# Patient Record
Sex: Male | Born: 1980 | Race: Asian | Hispanic: No | Marital: Married | Smoking: Never smoker
Health system: Southern US, Community
[De-identification: ages and names within clinical notes are randomized; demographics above are authoritative.]

---

## 2004-07-23 ENCOUNTER — Emergency Department (HOSPITAL_COMMUNITY): Admission: EM | Admit: 2004-07-23 | Discharge: 2004-07-24 | Payer: Self-pay | Admitting: Emergency Medicine

## 2008-10-21 ENCOUNTER — Emergency Department (HOSPITAL_COMMUNITY): Admission: EM | Admit: 2008-10-21 | Discharge: 2008-10-21 | Payer: Self-pay | Admitting: Emergency Medicine

## 2008-11-11 ENCOUNTER — Telehealth (INDEPENDENT_AMBULATORY_CARE_PROVIDER_SITE_OTHER): Payer: Self-pay | Admitting: *Deleted

## 2008-11-12 ENCOUNTER — Ambulatory Visit (HOSPITAL_COMMUNITY): Admission: RE | Admit: 2008-11-12 | Discharge: 2008-11-12 | Payer: Self-pay | Admitting: Gastroenterology

## 2008-11-12 ENCOUNTER — Ambulatory Visit: Payer: Self-pay | Admitting: Gastroenterology

## 2008-11-12 DIAGNOSIS — R1031 Right lower quadrant pain: Secondary | ICD-10-CM

## 2008-11-12 DIAGNOSIS — R1084 Generalized abdominal pain: Secondary | ICD-10-CM

## 2008-11-12 DIAGNOSIS — R110 Nausea: Secondary | ICD-10-CM

## 2008-11-12 DIAGNOSIS — R109 Unspecified abdominal pain: Secondary | ICD-10-CM | POA: Insufficient documentation

## 2008-11-15 ENCOUNTER — Telehealth: Payer: Self-pay | Admitting: Gastroenterology

## 2008-11-16 LAB — CONVERTED CEMR LAB
Albumin: 4.2 g/dL (ref 3.5–5.2)
Basophils Absolute: 0 10*3/uL (ref 0.0–0.1)
Basophils Relative: 0.5 % (ref 0.0–3.0)
CO2: 30 meq/L (ref 19–32)
Eosinophils Absolute: 0.2 10*3/uL (ref 0.0–0.7)
Eosinophils Relative: 1.8 % (ref 0.0–5.0)
GFR calc non Af Amer: 169.27 mL/min (ref >60)
Glucose, Bld: 98 mg/dL (ref 70–99)
H Pylori IgG: NEGATIVE
Lymphocytes Relative: 28.9 % (ref 12.0–46.0)
MCHC: 33.3 g/dL (ref 30.0–36.0)
Monocytes Absolute: 0.5 10*3/uL (ref 0.1–1.0)
Neutro Abs: 6.2 10*3/uL (ref 1.4–7.7)
Sodium: 136 meq/L (ref 135–145)
WBC: 9.7 10*3/uL (ref 4.5–10.5)

## 2008-11-22 ENCOUNTER — Ambulatory Visit: Payer: Self-pay | Admitting: Physician Assistant

## 2008-11-22 ENCOUNTER — Telehealth: Payer: Self-pay | Admitting: Physician Assistant

## 2008-11-22 LAB — CONVERTED CEMR LAB: Hep B S Ab: POSITIVE — AB

## 2008-11-24 ENCOUNTER — Telehealth: Payer: Self-pay | Admitting: Physician Assistant

## 2008-11-29 DIAGNOSIS — K7689 Other specified diseases of liver: Secondary | ICD-10-CM

## 2008-11-29 DIAGNOSIS — B1910 Unspecified viral hepatitis B without hepatic coma: Secondary | ICD-10-CM | POA: Insufficient documentation

## 2008-11-30 ENCOUNTER — Ambulatory Visit: Payer: Self-pay | Admitting: Internal Medicine

## 2008-11-30 DIAGNOSIS — R740 Nonspecific elevation of levels of transaminase and lactic acid dehydrogenase [LDH]: Secondary | ICD-10-CM

## 2008-12-14 ENCOUNTER — Telehealth: Payer: Self-pay | Admitting: Physician Assistant

## 2009-11-04 ENCOUNTER — Telehealth: Payer: Self-pay | Admitting: Physician Assistant

## 2010-02-17 ENCOUNTER — Emergency Department (HOSPITAL_COMMUNITY)
Admission: EM | Admit: 2010-02-17 | Discharge: 2010-02-17 | Payer: Self-pay | Source: Home / Self Care | Admitting: Emergency Medicine

## 2010-03-01 NOTE — Progress Notes (Signed)
Summary: Abd Pain  Phone Note Call from Patient Call back at Home Phone [phone]   Call For: Mike Gip, [place] Reason for Call: Talk to Nurse Summary of Call: Abd Pain Initial call taken by: Leanor Kail [facility],  November 04, 2009 11:16 AM  Follow-up for Phone Call        Patient c/o back pain, he thinks it is the same problem that Mike Gip PA saw him for last year.  He is having to lie down.  I have given him the number to [facility] that Mike Gip PA suggested last year.  He is also advised to try a Urgent care to help him with his back pain. Follow-up by: Darcey Nora RN, CGRN,  November 04, 2009 11:41 AM

## 2010-09-23 ENCOUNTER — Emergency Department (HOSPITAL_COMMUNITY): Payer: Self-pay

## 2010-09-23 ENCOUNTER — Emergency Department (HOSPITAL_COMMUNITY)
Admission: EM | Admit: 2010-09-23 | Discharge: 2010-09-23 | Disposition: A | Discharge status: Home / Self Care | Payer: Self-pay | Attending: Emergency Medicine | Admitting: Emergency Medicine

## 2010-09-23 DIAGNOSIS — R209 Unspecified disturbances of skin sensation: Secondary | ICD-10-CM | POA: Insufficient documentation

## 2010-09-23 DIAGNOSIS — H532 Diplopia: Secondary | ICD-10-CM | POA: Insufficient documentation

## 2010-09-23 LAB — POCT I-STAT, CHEM 8
Creatinine, Ser: 0.6 mg/dL (ref 0.50–1.35)
Hemoglobin: 16 g/dL (ref 13.0–17.0)
TCO2: 26 mmol/L (ref 0–100)

## 2012-09-01 ENCOUNTER — Ambulatory Visit (INDEPENDENT_AMBULATORY_CARE_PROVIDER_SITE_OTHER): Payer: BC Managed Care – PPO | Admitting: Emergency Medicine

## 2012-09-01 VITALS — BP 130/88 | HR 73 | Temp 98.0°F | Resp 16 | Ht 61.0 in | Wt 137.0 lb

## 2012-09-01 DIAGNOSIS — S139XXA Sprain of joints and ligaments of unspecified parts of neck, initial encounter: Secondary | ICD-10-CM

## 2012-09-01 DIAGNOSIS — S335XXA Sprain of ligaments of lumbar spine, initial encounter: Secondary | ICD-10-CM

## 2012-09-01 DIAGNOSIS — Z0000 Encounter for general adult medical examination without abnormal findings: Secondary | ICD-10-CM

## 2012-09-01 DIAGNOSIS — G5600 Carpal tunnel syndrome, unspecified upper limb: Secondary | ICD-10-CM

## 2012-09-01 LAB — POCT CBC
Granulocyte percent: 56 %G (ref 37–80)
Hemoglobin: 15.2 g/dL (ref 14.1–18.1)
Lymph, poc: 2.5 (ref 0.6–3.4)
MCHC: 32.8 g/dL (ref 31.8–35.4)
MCV: 87.9 fL (ref 80–97)
MPV: 9.8 fL (ref 0–99.8)
POC Granulocyte: 4 (ref 2–6.9)
POC LYMPH PERCENT: 35 %L (ref 10–50)
Platelet Count, POC: 302 10*3/uL (ref 142–424)
WBC: 7.2 10*3/uL (ref 4.6–10.2)

## 2012-09-01 MED ORDER — CYCLOBENZAPRINE HCL 10 MG PO TABS: 10.0000 mg | ORAL_TABLET | Freq: Three times a day (TID) | ORAL | Status: DC | PRN

## 2012-09-01 MED ORDER — NAPROXEN SODIUM 550 MG PO TABS: 550.0000 mg | ORAL_TABLET | Freq: Two times a day (BID) | ORAL | Status: DC

## 2012-09-01 NOTE — Patient Instructions (Addendum)
H?i Ch?ng ?ng C? Tay  (Carpal Tunnel Syndrome)  ?ng c? tay l m?t vng h?p n?m bn lng bn tay c?a c? tay. ?ng ???c hnh thnh b?i cc x??ng c? tay v dy ch?ng. Dy th?n kinh, m?ch mu v gn ?i qua ?ng c? tay. Chuy?n ??ng c? tay l?p ?i l?p l?i ho?c cc b?nh nh?t ??nh c th? gy s?ng trong ?ng ny. Ch? s?ng ny k?p dy th?n kinh chnh ? c? tay (dy th?n kinh trung v?) v gy tnh tr?ng ?au ? tay v cnh tay ???c g?i l h?i ch?ng ?ng c? tay. NGUYN NHN   Chuy?n ??ng c? tay l?p ?i l?p l?i.  Ch?n th??ng c? tay.  M?t s? b?nh c? th? nh? vim kh?p, b?nh ti?u ???ng, nghi?n r??u, c??ng gip v suy th?n.  Bo ph.  Mang thai. TRI?U CH?NG   C?m gic "r?n r?n nh? ki?n b" ? cc ngn tay ho?c bn tay.  Ng?a ran ho?c t ? ngn tay ho?c bn tay.  C?m gic ?au ? ton b? cnh tay.  ?au c? tay ?i ln cnh tay v vo vai.  ?au ?i xu?ng lng bn tay ho?c ngn tay.  C?m gic y?u trong bn tay. CH?N ?ON  Chuyn gia ch?m sc y t? s? xem b?nh s? c?a b?n v khm tr?c ti?p. C th? c?n ki?m tra ghi c? ?i?n. Ki?m tra ny ?o tn hi?u ?i?n ???c g?i ?i b?i cc c?. Cc tn hi?u ?i?n ny th??ng b? lm ch?m b?i h?i ch?ng ?ng c? tay. B?n c?ng c th? c?n ch?p X-quang.  ?I?U TR?  H?i ch?ng ?ng c? tay c th? t? kh?i. Chuyn gia ch?m sc y t? c?a b?n c th? ?? ngh? n?p c? tay ho?c s? d?ng thu?c, ch?ng h?n nh? thu?c khng vim khng c steroid. Tim hocmon ch?a vim v d? ?ng c th? gip ??. ?i khi, c th? c?n ph?u thu?t ?? gi?i phng dy th?n kinh b? k?p.  H??NG D?N CH?M SC T?I NH   S? d?ng t?t c? thu?c theo ch? d?n c?a chuyn gia ch?m sc y t?. Ch? s? d?ng thu?c mua tr?c ti?p t?i hi?u thu?c ho?c thu?c theo toa ?? gi?m ?au, gi?m s? kh ch?u ho?c h? s?t theo ch? d?n c?a chuyn gia ch?m sc y t? c?a b?n.  N?u b?n ???c n?p ?? gi? c? tay khng b? cong, hy s? d?ng n?p theo ch? d?n. B?n c?ng c?n mang n?p vo ban ?m. S? d?ng n?p cho ??n ch?ng no b?n cn b? ?au ho?c t trong bn tay, cnh tay ho?c c? tay. C th? m?t  1 ??n 2 thng.  Khng s? d?ng c? tay vo b?t k? ho?t ??ng no c th? gy ?au. N?u cc tri?u ch?ng c?a b?n c lin quan ??n cng vi?c, b?n c th? c?n ph?i ni chuy?n v?i s?p c?a mnh v? vi?c ??i sang m?t cng vi?c khng c?n s? d?ng c? tay.  Ch??m ? l?nh ln c? tay sau th?i gian di ho?t ??ng c? tay ko di.  Cho ? bo vo ti nh?a.  ??t kh?n t?m gi?a da v ti.  Ch??m ? l?nh trong 15 ??n 20 pht, 3 ??n 4 l?n m?i ngy.  Gi? m?i cu?c h?n khm l?i theo ch? d?n c?a chuyn gia ch?m sc y t?. Cc cu?c h?n ny bao g?m b?t k? cu?c h?n gi?i thi?u ch?nh hnh, v?t l tr? li?u v ph?c h?i ch?c n?ng no. B?t k? s? ch?m   tr? no trong vi?c nh?n ch?m sc c?n thi?t c th? d?n ??n s? ch?m tr? ho?c khng thnh cng trong vi?c ch?a lnh tnh tr?ng c?a b?n. HY NGAY L?P T?C THAM V?N V?I CHUYN GIA Y T? N?U:   B?n c nh?ng tri?u ch?ng m?i khng th? gi?i thch.  Cc tri?u ch?ng c?a b?n tr? nn t?i t? h?n v khng c?i thi?n ho?c ki?m sot ???c b?ng thu?c. ??M B?O B?N:   Hi?u cc h??ng d?n ny.  S? theo di tnh tr?ng c?a mnh.  S? yu c?u tr? gip ngay l?p t?c n?u b?n c?m th?y khng kh?e ho?c tnh tr?ng tr? nn t?i h?n. Document Released: 01/15/2005 Document Revised: 04/09/2011 [employer] Patient Information 2014 [employer].  

## 2012-09-01 NOTE — Progress Notes (Signed)
[facility] [address], [place] [address] [phone]  Date:  09/01/2012   Name:  Oscar [name]   DOB:  01/29/1981   MRN:  [id number]  PCP:  No PCP Per Patient    Chief Complaint: Headache, Neck Pain and Numbness   History of Present Illness:  Oscar [name] is a 32 y.o. very pleasant male patient who presents with the following:  Multiple complaints.  Has headaches and fatigue.  Has pain in back of neck and low back and radiating into leg on Left side.  Says he has numbness nad tingling in hands and a sensation of cold in his hands.  No history of injury or overuse.  Awakens at night with numbness and pain in median distribution.  No weakness.  No improvement in fatigue with tylenol or motrin.  Pain in low back worse with sitting or standing prolonged periods.  No improvement with over the counter medications or other home remedies. Denies other complaint or health concern today.   Patient Active Problem List   Diagnosis Date Noted  . ABNORMAL TRANSAMINASE, (LFT'S) 11/30/2008  . HEPATITIS B 11/29/2008  . FATTY LIVER DISEASE 11/29/2008  . NAUSEA ALONE 11/12/2008  . RLQ PAIN 11/12/2008  . ABDOMINAL PAIN, GENERALIZED 11/12/2008  . ABDOMINAL PAIN-MULTIPLE SITES 11/12/2008    History reviewed. No pertinent past medical history.  History reviewed. No pertinent past surgical history.  History  Substance Use Topics  . Smoking status: Never Smoker   . Smokeless tobacco: Not on file  . Alcohol Use: Yes    History reviewed. No pertinent family history.  No Known Allergies  Medication list has been reviewed and updated.  No current outpatient prescriptions on file prior to visit.   No current facility-administered medications on file prior to visit.    Review of Systems:  As per HPI, otherwise negative.    Physical Examination: Filed Vitals:   09/01/12 1452  BP: 130/88  Pulse: 73  Temp: 98 F (36.7 C)  Resp: 16   Filed Vitals:   09/01/12 1452   Height: 5\' 1"  (1.549 m)  Weight: 137 lb (62.143 kg)   Body mass index is 25.9 kg/(m^2). Ideal Body Weight: Weight in (lb) to have BMI = 25: 132  GEN: WDWN, NAD, Non-toxic, A & O x 3 HEENT: Atraumatic, Normocephalic. Neck supple. No masses, No LAD. Ears and Nose: No external deformity. CV: RRR, No M/G/R. No JVD. No thrill. No extra heart sounds. PULM: CTA B, no wheezes, crackles, rhonchi. No retractions. No resp. distress. No accessory muscle use. ABD: S, NT, ND, +BS. No rebound. No HSM. EXTR: No c/c/e  phalen and tinnel positive.   NEURO Normal gait.  PSYCH: Normally interactive. Conversant. Not depressed or anxious appearing.  Calm demeanor.    Assessment and Plan: Cervical and lumbar strain Carpal tunnel syndrome Never had labs done Labs   Signed,  Phillips Odor, MD

## 2012-09-02 LAB — COMPREHENSIVE METABOLIC PANEL
ALT: 29 U/L (ref 0–53)
Albumin: 4.8 g/dL (ref 3.5–5.2)
Calcium: 9.9 mg/dL (ref 8.4–10.5)
Creat: 0.85 mg/dL (ref 0.50–1.35)
Sodium: 138 mEq/L (ref 135–145)
Total Bilirubin: 0.6 mg/dL (ref 0.3–1.2)
Total Protein: 7.8 g/dL (ref 6.0–8.3)

## 2012-09-02 LAB — LIPID PANEL
Cholesterol: 201 mg/dL — ABNORMAL HIGH (ref 0–200)
Total CHOL/HDL Ratio: 4.2 Ratio

## 2012-09-02 LAB — VITAMIN D 25 HYDROXY (VIT D DEFICIENCY, FRACTURES): Vit D, 25-Hydroxy: 36 ng/mL (ref 30–89)

## 2012-09-02 LAB — VITAMIN B12: Vitamin B-12: 704 pg/mL (ref 211–911)

## 2012-09-02 LAB — TSH: TSH: 1.027 u[IU]/mL (ref 0.350–4.500)

## 2012-09-02 MED ORDER — ATORVASTATIN CALCIUM 20 MG PO TABS: 20.0000 mg | ORAL_TABLET | Freq: Every day | ORAL | Status: DC

## 2012-09-02 NOTE — Addendum Note (Signed)
Addended by: Carmelina Dane on: 09/02/2012 05:44 PM   Modules accepted: Orders

## 2012-09-15 ENCOUNTER — Ambulatory Visit (INDEPENDENT_AMBULATORY_CARE_PROVIDER_SITE_OTHER): Payer: BC Managed Care – PPO | Admitting: Neurology

## 2012-09-15 ENCOUNTER — Ambulatory Visit (INDEPENDENT_AMBULATORY_CARE_PROVIDER_SITE_OTHER): Payer: BC Managed Care – PPO

## 2012-09-15 DIAGNOSIS — G5600 Carpal tunnel syndrome, unspecified upper limb: Secondary | ICD-10-CM

## 2012-09-15 DIAGNOSIS — Z0289 Encounter for other administrative examinations: Secondary | ICD-10-CM

## 2012-09-15 DIAGNOSIS — R209 Unspecified disturbances of skin sensation: Secondary | ICD-10-CM

## 2012-09-15 DIAGNOSIS — G5602 Carpal tunnel syndrome, left upper limb: Secondary | ICD-10-CM

## 2012-09-15 NOTE — Procedures (Signed)
  HISTORY:  Oscar [name] is a 32 year old gentleman with a history of left hand numbness dating back several years. The patient also reports some discomfort in the left neck and shoulder area. The patient will occasionally drop things from the left hand. The patient is being evaluated for a possible neuropathy or a cervical radiculopathy.  NERVE CONDUCTION STUDIES:  Nerve conduction studies were performed on both upper extremities. The distal motor latencies for the median nerves were prolonged on the left, normal on the right, with normal motor amplitudes for these nerves bilaterally. The distal motor latencies and motor amplitudes for the ulnar nerves were normal bilaterally. The F wave latencies and nerve conduction velocities for the median and ulnar nerve were normal bilaterally. The sensory latencies for the median nerves were prolonged on the left, normal on the right, and normal for the ulnar nerves bilaterally.  EMG STUDIES:  EMG study was performed on the left upper extremity:  The first dorsal interosseous muscle reveals 2 to 4 K units with full recruitment. No fibrillations or positive waves were noted. The abductor pollicis brevis muscle reveals 2 to 4 K units with full recruitment. No fibrillations or positive waves were noted. The extensor indicis proprius muscle reveals 1 to 3 K units with full recruitment. No fibrillations or positive waves were noted. The pronator teres muscle reveals 2 to 3 K units with full recruitment. No fibrillations or positive waves were noted. The biceps muscle reveals 1 to 2 K units with full recruitment. No fibrillations or positive waves were noted. The triceps muscle reveals 2 to 4 K units with full recruitment. No fibrillations or positive waves were noted. The anterior deltoid muscle reveals 2 to 3 K units with full recruitment. No fibrillations or positive waves were noted. The cervical paraspinal muscles were tested at 2 levels. No abnormalities of  insertional activity were seen at either level tested. There was good relaxation.   IMPRESSION:  Nerve conduction studies done on both upper extremities reveals evidence of a mild left carpal tunnel syndrome. No other significant abnormalities were seen. EMG evaluation of the left upper extremity was normal. There is no evidence of a left cervical radiculopathy.  Marlan Palau MD 09/15/2012 4:32 PM  [facility] [address] [place], [place] [address]  Phone [phone] Fax [phone]

## 2012-09-19 ENCOUNTER — Telehealth: Payer: Self-pay

## 2012-09-19 NOTE — Telephone Encounter (Signed)
Patient is waiting on results from specialist doctor that our office referred him to.  [phone]

## 2012-09-22 ENCOUNTER — Telehealth: Payer: Self-pay | Admitting: Neurology

## 2012-09-22 NOTE — Telephone Encounter (Signed)
Okay. He is given the number to call them.

## 2012-09-22 NOTE — Telephone Encounter (Signed)
I called patient. The patient has left carpal tunnel syndrome. We have discussed this diagnosis before he left from the EMG evaluation. I went over with him once again. The patient is to go and contact his primary care physician to talk about treatment for this.

## 2012-09-24 ENCOUNTER — Ambulatory Visit (INDEPENDENT_AMBULATORY_CARE_PROVIDER_SITE_OTHER): Payer: BC Managed Care – PPO | Admitting: Emergency Medicine

## 2012-09-24 VITALS — BP 110/70 | HR 64 | Temp 98.3°F | Resp 18 | Ht 61.0 in | Wt 137.8 lb

## 2012-09-24 DIAGNOSIS — G5682 Other specified mononeuropathies of left upper limb: Secondary | ICD-10-CM

## 2012-09-24 DIAGNOSIS — G5600 Carpal tunnel syndrome, unspecified upper limb: Secondary | ICD-10-CM

## 2012-09-24 DIAGNOSIS — M5432 Sciatica, left side: Secondary | ICD-10-CM

## 2012-09-24 DIAGNOSIS — G5680 Other specified mononeuropathies of unspecified upper limb: Secondary | ICD-10-CM

## 2012-09-24 DIAGNOSIS — M5430 Sciatica, unspecified side: Secondary | ICD-10-CM

## 2012-09-24 NOTE — Patient Instructions (Addendum)
Oscar [name]  (Carpal Tunnel Syndrome)  [name] l m?t vng h?p n?m bn lng bn [name] c?a c? [name]. ?ng ???c hnh thnh b?i cc x?[name] v dy ch?ng. Dy th?n kinh, m?ch mu v gn ?i qua [name]. Chuy?n ?[name] l?p ?i l?p l?i ho?c cc b?nh nh?t ??nh c th? gy s?ng trong ?ng ny. Ch? s?ng ny k?p dy th?n kinh chnh ? c? [name] (dy th?n kinh trung v?) v gy tnh tr?ng ?au ? [name] v cnh [name] ???c g?i l Oscar [name]. NGUYN NHN   Chuy?n ?[name] l?p ?i l?p l?i.  Ch?n th?[name].  M?t s? b?nh c? th? nh? vim kh?p, b?nh ti?u ???ng, nghi?n r??u, c??ng gip v suy th?n.  Bo ph.  Mang thai. TRI?U CH?NG   C?m gic "r?n r?n nh? ki?n b" ? cc ngn [name] ho?c bn [name].  Ng?a ran ho?c t ? ngn [name] ho?c bn [name].  C?m gic ?au ? ton b? cnh [name].  ?au c? [name] ?i ln cnh [name] v vo vai.  ?au ?i xu?ng lng bn [name] ho?c ngn [name].  C?m gic y?u trong bn [name]. CH?N ?ON  Chuyn gia ch?m [place] y t? s? xem b?nh s? c?a b?n v khm tr?c ti?p. C th? c?n ki?m tra ghi c? ?i?n. Ki?m tra ny ?o tn hi?u ?i?n ???c g?i ?i b?i cc c?. Cc tn hi?u ?i?n ny th??ng b? lm ch?m b?i Oscar [name]. B?n c?ng c th? c?n ch?p X-quang.  ?I?U TR?  Oscar [name] c th? t? kh?i. Chuyn gia ch?m [place] y t? c?a b?n c th? ?? ngh? n?p c? [name] ho?c s? d?ng thu?c, ch?ng h?n nh? thu?c khng vim khng c steroid. Tim hocmon ch?a vim v d? ?ng c th? gip ??. ?i khi, c th? c?n ph?u thu?t ?? gi?i phng dy th?n kinh b? k?p.  H??NG D?N CH?M [place] T?I NH   S? d?ng t?t c? thu?c theo ch? d?n c?a chuyn gia ch?m [place] y t?. Ch? s? d?ng thu?c mua tr?c ti?p t?i hi?u thu?c ho?c thu?c theo toa ?? gi?m ?au, gi?m s? kh ch?u ho?c h? s?t theo ch? d?n c?a chuyn gia ch?m [place] y t? c?a b?n.  N?u b?n ???c n?p ?? gi? c? [name] khng b? cong, hy s? d?ng n?p theo ch? d?n. B?n c?ng c?n mang n?p vo ban ?m. S? d?ng n?p cho ??n ch?ng no b?n cn b? ?au ho?c t trong bn [name], cnh [name] ho?c c? [name]. C th? m?t  1 ??n 2 thng.  Khng s? d?ng c? [name] vo b?t k? ho?t ??ng no c th? gy ?au. N?u cc tri?u ch?ng c?a b?n c lin quan ??n cng vi?c, b?n c th? c?n ph?i ni chuy?n v?i s?p c?a mnh v? vi?c ??i sang m?t cng vi?c khng c?n s? d?ng c? [name].  Ch??m ? l?nh ln c? [name] sau th?i gian di ho?t ?[name] ko di.  Cho ? bo vo ti nh?a.  ??t kh?n t?m gi?a da v ti.  Ch??m ? l?nh trong 15 ??n 20 pht, 3 ??n 4 l?n m?i ngy.  Gi? m?i cu?c h?n khm l?i theo ch? d?n c?a chuyn gia ch?m [place] y t?. Cc cu?c h?n ny bao g?m b?t k? cu?c h?n gi?i thi?u ch?nh hnh, v?t l tr? li?u v ph?c Oscar ch?c n?ng no. B?t k? s? ch?m  tr? no trong vi?c nh?n ch?m [place] c?n thi?t c th? d?n ??n s? ch?m tr? ho?c khng thnh cng trong vi?c ch?a lnh tnh tr?ng c?a b?n. HY NGAY L?P T?C THAM V?N V?I CHUYN GIA Y T? N?U:   B?n c nh?ng tri?u ch?ng m?i khng th? gi?i thch.  Cc tri?u ch?ng c?a b?n tr? nn t?i t? h?n v khng c?i thi?n ho?c ki?m sot ???c b?ng thu?c. ??M B?O B?N:   Hi?u cc h??ng d?n ny.  S? theo di tnh tr?ng c?a mnh.  S? yu c?u tr? gip ngay l?p t?c n?u b?n c?m th?y khng kh?e ho?c tnh tr?ng tr? nn t?i h?n. Document Released: 01/15/2005 Document Revised: 04/09/2011 [facility] Patient Information 2014 [place], [place].

## 2012-09-24 NOTE — Progress Notes (Signed)
[facility] [address], [place] [address] [phone]  Date:  09/24/2012   Name:  Oscar [name]   DOB:  03/25/1980   MRN:  [id number]  PCP:  Carmelina Dane, MD    Chief Complaint: Follow-up   History of Present Illness:  Oscar [name] is a 32 y.o. 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  Is now complaining of pain into his shoulder from the hand.  Believes it associated with his hand.  Says that he has three bulging lumbar discs and improved with physical therapy in [place].  Worse now after 6 months of working.  Has pain in left thigh.    Patient Active Problem List   Diagnosis Date Noted  . ABNORMAL TRANSAMINASE, (LFT'S) 11/30/2008  . HEPATITIS B 11/29/2008  . FATTY LIVER DISEASE 11/29/2008  . NAUSEA ALONE 11/12/2008  . RLQ PAIN 11/12/2008  . ABDOMINAL PAIN, GENERALIZED 11/12/2008  . ABDOMINAL PAIN-MULTIPLE SITES 11/12/2008    History reviewed. No pertinent past medical history.  History reviewed. No pertinent past surgical history.  History  Substance Use Topics  . Smoking status: Never Smoker   . Smokeless tobacco: Not on file  . Alcohol Use: Yes    History reviewed. No pertinent family history.  No Known Allergies  Medication list has been reviewed and updated.  Current Outpatient Prescriptions on File Prior to Visit  Medication Sig Dispense Refill  . atorvastatin (LIPITOR) 20 MG tablet Take 1 tablet (20 mg total) by mouth daily.  90 tablet  3  . cyclobenzaprine (FLEXERIL) 10 MG tablet Take 1 tablet (10 mg total) by mouth 3 (three) times daily as needed for muscle spasms.  30 tablet  0  . naproxen sodium (ANAPROX DS) 550 MG tablet Take 1 tablet (550 mg total) by mouth 2 (two) times daily with a meal.  40 tablet  0   No current facility-administered medications on file prior to visit.    Review of Systems:  As per HPI, otherwise negative.    Physical Examination: Filed Vitals:   09/24/12 1543  BP: 110/70  Pulse: 64  Temp: 98.3 F (36.8 C)  Resp: 18   Filed Vitals:   09/24/12 1543  Height: 5\' 1"  (1.549 m)  Weight: 137 lb 12.8 oz (62.506 kg)   Body mass index is 26.05 kg/(m^2). Ideal Body Weight: Weight in (lb) to have BMI = 25: 132   GEN: WDWN, NAD, Non-toxic, Alert & Oriented x 3 HEENT: Atraumatic, Normocephalic.  Ears and Nose: No external deformity. EXTR: No clubbing/cyanosis/edema NEURO: Normal gait.  PSYCH: Normally interactive. Conversant. Not depressed or anxious appearing.  Calm demeanor.  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  Reproduces arm pain.  Neuro lower extremities is normal.  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   Signed,  Phillips Odor, MD

## 2012-12-04 ENCOUNTER — Other Ambulatory Visit: Payer: Self-pay

## 2013-08-02 ENCOUNTER — Emergency Department (HOSPITAL_COMMUNITY)
Admission: EM | Admit: 2013-08-02 | Discharge: 2013-08-02 | Disposition: A | Discharge status: Home / Self Care | Payer: BC Managed Care – PPO | Attending: Emergency Medicine | Admitting: Emergency Medicine

## 2013-08-02 ENCOUNTER — Emergency Department (HOSPITAL_COMMUNITY): Payer: BC Managed Care – PPO

## 2013-08-02 ENCOUNTER — Encounter (HOSPITAL_COMMUNITY): Payer: Self-pay | Admitting: Emergency Medicine

## 2013-08-02 DIAGNOSIS — Z791 Long term (current) use of non-steroidal anti-inflammatories (NSAID): Secondary | ICD-10-CM | POA: Insufficient documentation

## 2013-08-02 DIAGNOSIS — K529 Noninfective gastroenteritis and colitis, unspecified: Secondary | ICD-10-CM

## 2013-08-02 DIAGNOSIS — Z79899 Other long term (current) drug therapy: Secondary | ICD-10-CM | POA: Insufficient documentation

## 2013-08-02 DIAGNOSIS — K5289 Other specified noninfective gastroenteritis and colitis: Secondary | ICD-10-CM | POA: Insufficient documentation

## 2013-08-02 LAB — COMPREHENSIVE METABOLIC PANEL
ALBUMIN: 3.7 g/dL (ref 3.5–5.2)
ALT: 18 U/L (ref 0–53)
AST: 19 U/L (ref 0–37)
Alkaline Phosphatase: 56 U/L (ref 39–117)
Anion gap: 15 (ref 5–15)
BUN: 8 mg/dL (ref 6–23)
CALCIUM: 9.2 mg/dL (ref 8.4–10.5)
CHLORIDE: 97 meq/L (ref 96–112)
CO2: 24 meq/L (ref 19–32)
CREATININE: 0.62 mg/dL (ref 0.50–1.35)
GLUCOSE: 145 mg/dL — AB (ref 70–99)
Potassium: 3.1 mEq/L — ABNORMAL LOW (ref 3.7–5.3)
Sodium: 136 mEq/L — ABNORMAL LOW (ref 137–147)
TOTAL PROTEIN: 8 g/dL (ref 6.0–8.3)
Total Bilirubin: 0.6 mg/dL (ref 0.3–1.2)

## 2013-08-02 LAB — URINALYSIS, ROUTINE W REFLEX MICROSCOPIC
BILIRUBIN URINE: NEGATIVE
Glucose, UA: NEGATIVE mg/dL
HGB URINE DIPSTICK: NEGATIVE
Ketones, ur: NEGATIVE mg/dL
Leukocytes, UA: NEGATIVE
Nitrite: NEGATIVE
PH: 6.5 (ref 5.0–8.0)
PROTEIN: NEGATIVE mg/dL
SPECIFIC GRAVITY, URINE: 1.016 (ref 1.005–1.030)
Urobilinogen, UA: 0.2 mg/dL (ref 0.0–1.0)

## 2013-08-02 LAB — CBC WITH DIFFERENTIAL/PLATELET
BASOS ABS: 0 10*3/uL (ref 0.0–0.1)
BASOS PCT: 0 % (ref 0–1)
Eosinophils Absolute: 0 10*3/uL (ref 0.0–0.7)
Eosinophils Relative: 0 % (ref 0–5)
HEMATOCRIT: 42.2 % (ref 39.0–52.0)
HEMOGLOBIN: 14.6 g/dL (ref 13.0–17.0)
LYMPHS PCT: 14 % (ref 12–46)
Lymphs Abs: 1.8 10*3/uL (ref 0.7–4.0)
MCH: 29 pg (ref 26.0–34.0)
MCHC: 34.6 g/dL (ref 30.0–36.0)
MCV: 83.7 fL (ref 78.0–100.0)
Monocytes Absolute: 0.9 10*3/uL (ref 0.1–1.0)
Monocytes Relative: 7 % (ref 3–12)
NEUTROS ABS: 10.6 10*3/uL — AB (ref 1.7–7.7)
Neutrophils Relative %: 79 % — ABNORMAL HIGH (ref 43–77)
Platelets: 249 10*3/uL (ref 150–400)
RBC: 5.04 MIL/uL (ref 4.22–5.81)
RDW: 13.2 % (ref 11.5–15.5)
WBC: 13.3 10*3/uL — ABNORMAL HIGH (ref 4.0–10.5)

## 2013-08-02 LAB — LIPASE, BLOOD: Lipase: 20 U/L (ref 11–59)

## 2013-08-02 MED ORDER — DICYCLOMINE HCL 20 MG PO TABS: ORAL_TABLET | ORAL | Status: DC

## 2013-08-02 MED ORDER — PROMETHAZINE HCL 25 MG PO TABS: 25.0000 mg | ORAL_TABLET | Freq: Four times a day (QID) | ORAL | Status: DC | PRN

## 2013-08-02 MED ORDER — KETOROLAC TROMETHAMINE 30 MG/ML IJ SOLN
30.0000 mg | Freq: Once | INTRAMUSCULAR | Status: AC
  Administered 2013-08-02: 30 mg via INTRAVENOUS
  Filled 2013-08-02: qty 1

## 2013-08-02 MED ORDER — SODIUM CHLORIDE 0.9 % IV BOLUS (SEPSIS)
1000.0000 mL | Freq: Once | INTRAVENOUS | Status: AC
  Administered 2013-08-02: 1000 mL via INTRAVENOUS

## 2013-08-02 NOTE — ED Notes (Signed)
Pt is in a lot of pain in stomach. He states he can feel it to his back and it feels cold.

## 2013-08-02 NOTE — ED Notes (Signed)
Patient transported to X-ray 

## 2013-08-02 NOTE — ED Notes (Signed)
The pt is c/o  A headache and and flank pain with nv and diarrhea since Thursday.  His bones feel cold and he is hurting all over his body

## 2013-08-02 NOTE — Discharge Instructions (Signed)
Follow up with your md in 1-2 days for recheck.  Drink plenty of fluids

## 2013-08-02 NOTE — ED Notes (Signed)
Patient declines wheelchair at discharge.  Patient escorted to lobby by RN.  

## 2013-08-02 NOTE — ED Provider Notes (Signed)
CSN: [id number]     Arrival date & time 08/02/13  1604 History   First MD Initiated Contact with Patient 08/02/13 1751     Chief Complaint  Patient presents with  . multiple complaints      (Consider location/radiation/quality/duration/timing/severity/associated sxs/prior Treatment) Patient is a 33 y.o. male presenting with diarrhea. The history is provided by the patient (the pt comlains of abd cramps and diarhea since yesterday).  Diarrhea Quality:  Malodorous Severity:  Moderate Onset quality:  Sudden Timing:  Constant Progression:  Unchanged Relieved by:  Nothing Associated symptoms: no abdominal pain and no headaches     History reviewed. No pertinent past medical history. History reviewed. No pertinent past surgical history. No family history on file. History  Substance Use Topics  . Smoking status: Never Smoker   . Smokeless tobacco: Not on file  . Alcohol Use: Yes    Review of Systems  Constitutional: Negative for appetite change and fatigue.  HENT: Negative for congestion, ear discharge and sinus pressure.   Eyes: Negative for discharge.  Respiratory: Negative for cough.   Cardiovascular: Negative for chest pain.  Gastrointestinal: Positive for diarrhea. Negative for abdominal pain.  Genitourinary: Negative for frequency and hematuria.  Musculoskeletal: Negative for back pain.  Skin: Negative for rash.  Neurological: Negative for seizures and headaches.  Psychiatric/Behavioral: Negative for hallucinations.      Allergies  Review of patient's allergies indicates no known allergies.  Home Medications   Prior to Admission medications   Medication Sig Start Date End Date Taking? Authorizing Provider  atorvastatin (LIPITOR) 20 MG tablet Take 1 tablet (20 mg total) by mouth daily. 09/02/12   Phillips OdorJeffery Anderson, MD  cyclobenzaprine (FLEXERIL) 10 MG tablet Take 1 tablet (10 mg total) by mouth 3 (three) times daily as needed for muscle spasms. 09/01/12   Phillips OdorJeffery  Anderson, MD  dicyclomine (BENTYL) 20 MG tablet Take one every 6 hours for abdominal cramps 08/02/13   Benny LennertJoseph L Taevin Mcferran, MD  naproxen sodium (ANAPROX DS) 550 MG tablet Take 1 tablet (550 mg total) by mouth 2 (two) times daily with a meal. 09/01/12 09/01/13  Phillips OdorJeffery Anderson, MD  promethazine (PHENERGAN) 25 MG tablet Take 1 tablet (25 mg total) by mouth every 6 (six) hours as needed for nausea or vomiting. 08/02/13   Benny LennertJoseph L Yadriel Kerrigan, MD   BP 107/79  Pulse 84  Temp(Src) 99.3 F (37.4 C) (Oral)  Resp 20  SpO2 98% Physical Exam  Constitutional: He is oriented to person, place, and time. He appears well-developed.  HENT:  Head: Normocephalic.  Eyes: Conjunctivae and EOM are normal. No scleral icterus.  Neck: Neck supple. No thyromegaly present.  Cardiovascular: Normal rate and regular rhythm.  Exam reveals no gallop and no friction rub.   No murmur heard. Pulmonary/Chest: No stridor. He has no wheezes. He has no rales. He exhibits no tenderness.  Abdominal: He exhibits no distension. There is tenderness. There is no rebound.  Mild tenderness throughout abd  Musculoskeletal: Normal range of motion. He exhibits no edema.  Lymphadenopathy:    He has no cervical adenopathy.  Neurological: He is oriented to person, place, and time. He exhibits normal muscle tone. Coordination normal.  Skin: No rash noted. No erythema.  Psychiatric: He has a normal mood and affect. His behavior is normal.    ED Course  Procedures (including critical care time) Labs Review Labs Reviewed  CBC WITH DIFFERENTIAL - Abnormal; Notable for the following:    WBC 13.3 (*)  Neutrophils Relative % 79 (*)    Neutro Abs 10.6 (*)    All other components within normal limits  COMPREHENSIVE METABOLIC PANEL - Abnormal; Notable for the following:    Sodium 136 (*)    Potassium 3.1 (*)    Glucose, Bld 145 (*)    All other components within normal limits  URINALYSIS, ROUTINE W REFLEX MICROSCOPIC - Abnormal; Notable for the  following:    APPearance CLOUDY (*)    All other components within normal limits  LIPASE, BLOOD    Imaging Review Dg Abd Acute W/chest  08/02/2013   CLINICAL DATA:  Diffuse abdominal pain, vomiting, nausea.  EXAM: ACUTE ABDOMEN SERIES (ABDOMEN 2 VIEW & CHEST 1 VIEW)  COMPARISON:  02/17/2010  FINDINGS: There is no evidence of dilated bowel loops or free intraperitoneal air. No radiopaque calculi or other significant radiographic abnormality is seen. Heart size and mediastinal contours are within normal limits. Both lungs are clear.  IMPRESSION: Negative abdominal radiographs.  No acute cardiopulmonary disease.   Electronically Signed   By: Charlett NoseKevin  Dover M.D.   On: 08/02/2013 18:51     EKG Interpretation None      MDM   Final diagnoses:  Gastroenteritis    Gastroenteritis.  tx with bentyl and phenergan and close follow up with pcp    Benny LennertJoseph L Harvey Lingo, MD 08/02/13 [phone]

## 2013-08-04 ENCOUNTER — Ambulatory Visit (INDEPENDENT_AMBULATORY_CARE_PROVIDER_SITE_OTHER): Payer: BC Managed Care – PPO | Admitting: Family Medicine

## 2013-08-04 VITALS — BP 104/70 | HR 61 | Temp 97.7°F | Resp 16 | Ht 61.0 in | Wt 139.4 lb

## 2013-08-04 DIAGNOSIS — R1084 Generalized abdominal pain: Secondary | ICD-10-CM

## 2013-08-04 DIAGNOSIS — R51 Headache: Secondary | ICD-10-CM

## 2013-08-04 DIAGNOSIS — R197 Diarrhea, unspecified: Secondary | ICD-10-CM

## 2013-08-04 DIAGNOSIS — R110 Nausea: Secondary | ICD-10-CM

## 2013-08-04 LAB — POCT URINALYSIS DIPSTICK
Bilirubin, UA: NEGATIVE
Blood, UA: NEGATIVE
Glucose, UA: NEGATIVE
Ketones, UA: NEGATIVE
Leukocytes, UA: NEGATIVE
NITRITE UA: NEGATIVE
PH UA: 6
PROTEIN UA: NEGATIVE
Urobilinogen, UA: 0.2

## 2013-08-04 LAB — POCT UA - MICROSCOPIC ONLY
BACTERIA, U MICROSCOPIC: NEGATIVE
CRYSTALS, UR, HPF, POC: NEGATIVE
Casts, Ur, LPF, POC: NEGATIVE
EPITHELIAL CELLS, URINE PER MICROSCOPY: NEGATIVE
MUCUS UA: NEGATIVE
YEAST UA: NEGATIVE

## 2013-08-04 LAB — CBC
HEMATOCRIT: 40.4 % (ref 39.0–52.0)
HEMOGLOBIN: 14.2 g/dL (ref 13.0–17.0)
MCH: 28.6 pg (ref 26.0–34.0)
MCHC: 35.1 g/dL (ref 30.0–36.0)
MCV: 81.3 fL (ref 78.0–100.0)
Platelets: 302 10*3/uL (ref 150–400)
RBC: 4.97 MIL/uL (ref 4.22–5.81)
RDW: 13.3 % (ref 11.5–15.5)
WBC: 5.9 10*3/uL (ref 4.0–10.5)

## 2013-08-04 MED ORDER — TRAMADOL HCL 50 MG PO TABS: ORAL_TABLET | ORAL | Status: DC

## 2013-08-04 MED ORDER — DIPHENOXYLATE-ATROPINE 2.5-0.025 MG PO TABS: 1.0000 | ORAL_TABLET | Freq: Four times a day (QID) | ORAL | Status: DC | PRN

## 2013-08-04 NOTE — Progress Notes (Signed)
Subjective: 33 year old patient who was in the emergency room a couple days ago. He had diarrhea onset late night Friday. He has had cramping and diarrhea ever since then. He had some fever low grade on Sunday. He to the emergency room and workup suggested gastroenteritis. His white count is 13,000 the time. He has continued to feel bad. The diarrhea persists. He does have been able to go to work. Is not actually having diarrhea for 5 times a day. He has a lot of abdominal cramping. He hurts in his epigastrium and across lower abdomen. He has not been vomiting though he has been nauseous. He's been taking Phenergan and Bentyl without relief. He has bad headache.  Objective: His TMs are normal. Eyes PERRLA. Throat clear. Neck supple without nodes or thyromegaly. Chest clear. Heart regular without murmurs. Abdomen has occasional bowel sounds. Soft but tender in the epigastrium, across the upper abdomen, across lower abdomen in both lower quadrants. Skin unremarkable.   Results for orders placed in visit on 08/04/13  POCT URINALYSIS DIPSTICK      Result Value Ref Range   Color, UA yellow     Clarity, UA clear     Glucose, UA neg     Bilirubin, UA neg     Ketones, UA neg     Spec Grav, UA <=1.005     Blood, UA neg     pH, UA 6.0     Protein, UA neg     Urobilinogen, UA 0.2     Nitrite, UA neg     Leukocytes, UA Negative    POCT UA - MICROSCOPIC ONLY      Result Value Ref Range   WBC, Ur, HPF, POC 0-1     RBC, urine, microscopic 0-1     Bacteria, U Microscopic neg     Mucus, UA neg     Epithelial cells, urine per micros neg     Crystals, Ur, HPF, POC neg     Casts, Ur, LPF, POC neg     Yeast, UA neg     Assessment: Abdominal cramping pain and diarrhea and nausea Headaches  Plan: Try Lomotil. Gave him tramadol for the headache. Check labs. Unfortunately our CBC machine is down and won't have that count back up the leg. The urinalysis is normal. See instructions

## 2013-08-04 NOTE — Patient Instructions (Addendum)
Stay out of work through tomorrow.  Take lomotil (diphenoxylate) one tab 4 times daily for diarrhea.  Discontinue the diclyclomine (bentyl)  Tramadol one every 6 hours if needed for headache.  Return if not improving.  If severe abdominal pain at any time return or go to ER.

## 2013-08-05 ENCOUNTER — Telehealth: Payer: Self-pay | Admitting: Radiology

## 2013-08-05 DIAGNOSIS — R1084 Generalized abdominal pain: Secondary | ICD-10-CM

## 2013-08-05 NOTE — Telephone Encounter (Signed)
Per [facility], pt's BMP couldn't be ran due to there being no label on the SST tube. I talked to Dr Alwyn RenHopper, and he said to call the patient and see how he is feeling. If he is feeling better, he doesn't need to come back in for another blood draw. If, however, he is not feeling any better, he will need to rtc for another BMP.   I called pt and lmom to cb.

## 2013-08-06 NOTE — Telephone Encounter (Signed)
Spoke to pt, he is still experiencing abdominal pain, it has improved slightly only. He will RTC to have his BMP drawn today. Future order placed for BMP.

## 2013-08-19 NOTE — Telephone Encounter (Signed)
Is not taking the medication, for diarrhea.     Has pain in his stomach to his back.    Needs medication for pain.    [phone]

## 2013-09-29 ENCOUNTER — Ambulatory Visit (INDEPENDENT_AMBULATORY_CARE_PROVIDER_SITE_OTHER): Payer: BC Managed Care – PPO | Admitting: Family Medicine

## 2013-09-29 VITALS — BP 110/84 | HR 64 | Temp 98.1°F | Resp 18 | Ht 61.0 in | Wt 139.0 lb

## 2013-09-29 DIAGNOSIS — E785 Hyperlipidemia, unspecified: Secondary | ICD-10-CM

## 2013-09-29 DIAGNOSIS — M545 Low back pain, unspecified: Secondary | ICD-10-CM

## 2013-09-29 DIAGNOSIS — M542 Cervicalgia: Secondary | ICD-10-CM

## 2013-09-29 DIAGNOSIS — G5603 Carpal tunnel syndrome, bilateral upper limbs: Secondary | ICD-10-CM

## 2013-09-29 DIAGNOSIS — G5600 Carpal tunnel syndrome, unspecified upper limb: Secondary | ICD-10-CM

## 2013-09-29 LAB — COMPLETE METABOLIC PANEL WITH GFR
ALT: 31 U/L (ref 0–53)
AST: 29 U/L (ref 0–37)
Albumin: 4.8 g/dL (ref 3.5–5.2)
Alkaline Phosphatase: 48 U/L (ref 39–117)
BUN: 12 mg/dL (ref 6–23)
CO2: 27 mEq/L (ref 19–32)
Calcium: 9.8 mg/dL (ref 8.4–10.5)
Chloride: 100 mEq/L (ref 96–112)
Creat: 0.58 mg/dL (ref 0.50–1.35)
GFR, Est African American: >89 mL/min
GFR, Est Non African American: >89 mL/min
Glucose, Bld: 85 mg/dL (ref 70–99)
Potassium: 4 mEq/L (ref 3.5–5.3)
Sodium: 138 mEq/L (ref 135–145)
Total Bilirubin: 0.6 mg/dL (ref 0.2–1.2)
Total Protein: 8.1 g/dL (ref 6.0–8.3)

## 2013-09-29 LAB — LIPID PANEL
Cholesterol: 215 mg/dL — ABNORMAL HIGH (ref 0–200)
HDL: 52 mg/dL (ref >39)
LDL Cholesterol: 114 mg/dL — ABNORMAL HIGH (ref 0–99)
Total CHOL/HDL Ratio: 4.1 Ratio
Triglycerides: 246 mg/dL — ABNORMAL HIGH (ref <150)
VLDL: 49 mg/dL — ABNORMAL HIGH (ref 0–40)

## 2013-09-29 MED ORDER — PREDNISONE 20 MG PO TABS: 40.0000 mg | ORAL_TABLET | Freq: Every day | ORAL | Status: DC

## 2013-09-29 NOTE — Progress Notes (Signed)
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 He comes in with several years of low back pain, 1 year of bilateral carpal tunnel, worse on the left, left upper neck pain, left knee pain, and need for reevaluation of cholesterol levels.  Patient's been on Lipitor for quite some time. He denies jaundice or right upper quadrant pain.  Patient notes bilateral volar wrist pain, worse on the left where it radiates into the thenar area. He has full range of motion of both wrists, in all digits. Patient is tender with light palpation over the volar wrist.  Patient has no pain when he bends over has no weakness in either leg clear denies numbness in his legs either.  Objective: Patient is alert and cooperative and in no acute distress Neck range of motion is normal. He is mildly tender in the left upper posterior cervical spine. Is no overlying rash. Patient is normal straight leg raising. His reflexes in his elbows and knees and ankles are all symmetric. There is no muscle wasting in any of his extremities.  Does have a positive Tinel's of the left wrist. There is no swelling or bony abnormality is seen.  I reviewed his medical record and he's been to the emergency room several times, he is seen a neurologist who diagnosed him with carpal tunnel syndrome, and referred back to [place].  Assessment: Multiple problems referable to join and muscle. He also has hyperlipidemia history.  It's possible that some of these muscle tissues may be related to the Lipitor.  Plan:Bilateral carpal tunnel syndrome - Plan: predniSONE (DELTASONE) 20 MG tablet  Other and unspecified hyperlipidemia - Plan: Lipid panel, COMPLETE METABOLIC PANEL WITH GFR  Neck pain - Plan: predniSONE (DELTASONE) 20 MG tablet  Bilateral low back pain without sciatica - Plan: predniSONE (DELTASONE) 20 MG tablet  Signed, Elvina Sidle, MD

## 2013-09-29 NOTE — Patient Instructions (Signed)
High Cholesterol High cholesterol refers to having a high level of cholesterol in your blood. Cholesterol is a white, waxy, fat-like protein that your body needs in small amounts. Your liver makes all the cholesterol you need. Excess cholesterol comes from the food you eat. Cholesterol travels in your bloodstream through your blood vessels. If you have high cholesterol, deposits (plaque) may build up on the walls of your blood vessels. This makes the arteries narrower and stiffer. Plaque increases your risk of heart attack and stroke. Work with your health care provider to keep your cholesterol levels in a healthy range. RISK FACTORS Several things can make you more likely to have high cholesterol. These include:   Eating foods high in animal fat (saturated fat) or cholesterol.  Being overweight.  Not getting enough exercise.  Having a family history of high cholesterol. SIGNS AND SYMPTOMS High cholesterol does not cause symptoms. DIAGNOSIS  Your health care provider can do a blood test to check whether you have high cholesterol. If you are older than 20, your health care provider may check your cholesterol every 4-6 years. You may be checked more often if you already have high cholesterol or other risk factors for heart disease. The blood test for cholesterol measures the following:  Bad cholesterol (LDL cholesterol). This is the type of cholesterol that causes heart disease. This number should be less than 100.  Good cholesterol (HDL cholesterol). This type helps protect against heart disease. A healthy level of HDL cholesterol is 60 or higher.  Total cholesterol. This is the combined number of LDL cholesterol and HDL cholesterol. A healthy number is less than 200. TREATMENT  High cholesterol can be treated with diet changes, lifestyle changes, and medicine.   Diet changes may include eating more whole grains, fruits, vegetables, nuts, and fish. You may also have to cut back on red meat  and foods with a lot of added sugar.  Lifestyle changes may include getting at least 40 minutes of aerobic exercise three times a week. Aerobic exercises include walking, biking, and swimming. Aerobic exercise along with a healthy diet can help you maintain a healthy weight. Lifestyle changes may also include quitting smoking.  If diet and lifestyle changes are not enough to lower your cholesterol, your health care provider may prescribe a statin medicine. This medicine has been shown to lower cholesterol and also lower the risk of heart disease. HOME CARE INSTRUCTIONS  Only take over-the-counter or prescription medicines as directed by your health care provider.   Follow a healthy diet as directed by your health care provider. For instance:   Eat chicken (without skin), fish, veal, shellfish, ground turkey breast, and round or loin cuts of red meat.  Do not eat fried foods and fatty meats, such as hot dogs and salami.   Eat plenty of fruits, such as apples.   Eat plenty of vegetables, such as broccoli, potatoes, and carrots.   Eat beans, peas, and lentils.   Eat grains, such as barley, rice, couscous, and bulgur wheat.   Eat pasta without cream sauces.   Use skim or nonfat milk and low-fat or nonfat yogurt and cheeses. Do not eat or drink whole milk, cream, ice cream, egg yolks, and hard cheeses.   Do not eat stick margarine or tub margarines that contain trans fats (also called partially hydrogenated oils).   Do not eat cakes, cookies, crackers, or other baked goods that contain trans fats.   Do not eat saturated tropical oils, such as   coconut and palm oil.   Exercise as directed by your health care provider. Increase your activity level with activities such as gardening or walking.   Keep all follow-up appointments.  SEEK MEDICAL CARE IF:  You are struggling to maintain a healthy diet or weight.  You need help starting an exercise program.  You need help to  stop smoking. SEEK IMMEDIATE MEDICAL CARE IF:  You have chest pain.  You have trouble breathing. Document Released: 01/15/2005 Document Revised: 06/01/2013 Document Reviewed: 11/07/2012 [facility] Patient Information 2015 [place], [place]. This information is not intended to replace advice given to you by your health care provider. Make sure you discuss any questions you have with your health care provider. Carpal Tunnel Release Carpal tunnel release is done to relieve the pressure on the nerves and tendons on the bottom side of your wrist.  LET YOUR CAREGIVER KNOW ABOUT:   Allergies to food or medicine.  Medicines taken, including vitamins, herbs, eyedrops, over-the-counter medicines, and creams.  Use of steroids (by mouth or creams).  Previous problems with anesthetics or numbing medicines.  History of bleeding problems or blood clots.  Previous surgery.  Other health problems, including diabetes and kidney problems.  Possibility of pregnancy, if this applies. RISKS AND COMPLICATIONS  Some problems that may happen after this procedure include:  Infection.  Damage to the nerves, arteries or tendons could occur. This would be very uncommon.  Bleeding. BEFORE THE PROCEDURE   This surgery may be done while you are asleep (general anesthetic) or may be done under a block where only your forearm and the surgical area is numb.  If the surgery is done under a block, the numbness will gradually wear off within several hours after surgery. HOME CARE INSTRUCTIONS   Have a responsible person with you for 24 hours.  Do not drive a car or use public transportation for 24 hours.  Only take over-the-counter or prescription medicines for pain, discomfort, or fever as directed by your caregiver. Take them as directed.  You may put ice on the palm side of the affected wrist.  Put ice in a plastic bag.  Place a towel between your skin and the bag.  Leave the ice on for 20 to 30  minutes, 4 times per day.  If you were given a splint to keep your wrist from bending, use it as directed. It is important to wear the splint at night or as directed. Use the splint for as long as you have pain or numbness in your hand, arm, or wrist. This may take 1 to 2 months.  Keep your hand raised (elevated) above the level of your heart as much as possible. This keeps swelling down and helps with discomfort.  Change bandages (dressings) as directed.  Keep the wound clean and dry. SEEK MEDICAL CARE IF:   You develop pain not relieved with medications.  You develop numbness of your hand.  You develop bleeding from your surgical site.  You have an oral temperature above 102 F (38.9 C).  You develop redness or swelling of the surgical site.  You develop new, unexplained problems. SEEK IMMEDIATE MEDICAL CARE IF:   You develop a rash.  You have difficulty breathing.  You develop any reaction or side effects to medications given. Document Released: 04/07/2003 Document Revised: 04/09/2011 Document Reviewed: 11/21/2006 [facility] Patient Information 2015 [place], [place]. This information is not intended to replace advice given to you by your health care provider. Make sure you discuss any  questions you have with your health care provider.  

## 2013-10-05 ENCOUNTER — Ambulatory Visit (INDEPENDENT_AMBULATORY_CARE_PROVIDER_SITE_OTHER): Payer: BC Managed Care – PPO | Admitting: Family Medicine

## 2013-10-05 VITALS — BP 120/76 | HR 62 | Temp 98.0°F | Resp 16 | Ht 61.0 in | Wt 140.4 lb

## 2013-10-05 DIAGNOSIS — M791 Myalgia, unspecified site: Secondary | ICD-10-CM

## 2013-10-05 DIAGNOSIS — G5600 Carpal tunnel syndrome, unspecified upper limb: Secondary | ICD-10-CM

## 2013-10-05 DIAGNOSIS — M5432 Sciatica, left side: Secondary | ICD-10-CM

## 2013-10-05 DIAGNOSIS — G5602 Carpal tunnel syndrome, left upper limb: Secondary | ICD-10-CM

## 2013-10-05 DIAGNOSIS — M5430 Sciatica, unspecified side: Secondary | ICD-10-CM

## 2013-10-05 DIAGNOSIS — E876 Hypokalemia: Secondary | ICD-10-CM

## 2013-10-05 DIAGNOSIS — E785 Hyperlipidemia, unspecified: Secondary | ICD-10-CM

## 2013-10-05 DIAGNOSIS — L309 Dermatitis, unspecified: Secondary | ICD-10-CM

## 2013-10-05 DIAGNOSIS — IMO0001 Reserved for inherently not codable concepts without codable children: Secondary | ICD-10-CM

## 2013-10-05 DIAGNOSIS — L259 Unspecified contact dermatitis, unspecified cause: Secondary | ICD-10-CM

## 2013-10-05 MED ORDER — SIMVASTATIN 20 MG PO TABS: 20.0000 mg | ORAL_TABLET | Freq: Every day | ORAL | Status: AC

## 2013-10-05 MED ORDER — METHYLPREDNISOLONE (PAK) 4 MG PO TABS: ORAL_TABLET | ORAL | Status: DC

## 2013-10-05 MED ORDER — POTASSIUM CHLORIDE CRYS ER 20 MEQ PO TBCR: 20.0000 meq | EXTENDED_RELEASE_TABLET | Freq: Two times a day (BID) | ORAL | Status: DC

## 2013-10-05 MED ORDER — FLUOCINONIDE-E 0.05 % EX CREA: 1.0000 "application " | TOPICAL_CREAM | Freq: Two times a day (BID) | CUTANEOUS | Status: AC

## 2013-10-05 NOTE — Patient Instructions (Signed)
Cholesterol Cholesterol is a white, waxy, fat-like substance needed by your body in small amounts. The liver makes all the cholesterol you need. Cholesterol is carried from the liver by the blood through the blood vessels. Deposits of cholesterol (plaque) may build up on blood vessel walls. These make the arteries narrower and stiffer. Cholesterol plaques increase the risk for heart attack and stroke.  You cannot feel your cholesterol level even if it is very high. The only way to know it is high is with a blood test. Once you know your cholesterol levels, you should keep a record of the test results. Work with your health care provider to keep your levels in the desired range.  WHAT DO THE RESULTS MEAN?  Total cholesterol is a rough measure of all the cholesterol in your blood.   LDL is the so-called bad cholesterol. This is the type that deposits cholesterol in the walls of the arteries. You want this level to be low.   HDL is the good cholesterol because it cleans the arteries and carries the LDL away. You want this level to be high.  Triglycerides are fat that the body can either burn for energy or store. High levels are closely linked to heart disease.  WHAT ARE THE DESIRED LEVELS OF CHOLESTEROL?  Total cholesterol below 200.   LDL below 100 for people at risk, below 70 for those at very high risk.   HDL above 50 is good, above 60 is best.   Triglycerides below 150.  HOW CAN I LOWER MY CHOLESTEROL?  Diet. Follow your diet programs as directed by your health care provider.   Choose fish or white meat chicken and [place], roasted or baked. Limit fatty cuts of red meat, fried foods, and processed meats, such as sausage and lunch meats.   Eat lots of fresh fruits and vegetables.  Choose whole grains, beans, pasta, potatoes, and cereals.   Use only small amounts of olive, corn, or canola oils.   Avoid butter, mayonnaise, shortening, or palm kernel oils.  Avoid foods with  trans fats.   Drink skim or nonfat milk and eat low-fat or nonfat yogurt and cheeses. Avoid whole milk, cream, ice cream, egg yolks, and full-fat cheeses.   Healthy desserts include angel food cake, ginger snaps, animal crackers, hard candy, popsicles, and low-fat or nonfat frozen yogurt. Avoid pastries, cakes, pies, and cookies.   Exercise. Follow your exercise programs as directed by your health care provider.   A regular program helps decrease LDL and raise HDL.   A regular program helps with weight control.   Do things that increase your activity level like gardening, walking, or taking the stairs. Ask your health care provider about how you can be more active in your daily life.   Medicine. Take medicine only as directed by your health care provider.   Medicine may be prescribed by your health care provider to help lower cholesterol and decrease the risk for heart disease.   If you have several risk factors, you may need medicine even if your levels are normal. Document Released: 10/10/2000 Document Revised: 06/01/2013 Document Reviewed: 10/29/2012 [facility] Patient Information 2015 [place], [place]. This information is not intended to replace advice given to you by your health care provider. Make sure you discuss any questions you have with your health care provider. Carpal Tunnel Release Carpal tunnel release is done to relieve the pressure on the nerves and tendons on the bottom side of your wrist.  LET YOUR CAREGIVER  KNOW ABOUT:   Allergies to food or medicine.  Medicines taken, including vitamins, herbs, eyedrops, over-the-counter medicines, and creams.  Use of steroids (by mouth or creams).  Previous problems with anesthetics or numbing medicines.  History of bleeding problems or blood clots.  Previous surgery.  Other health problems, including diabetes and kidney problems.  Possibility of pregnancy, if this applies. RISKS AND COMPLICATIONS  Some problems  that may happen after this procedure include:  Infection.  Damage to the nerves, arteries or tendons could occur. This would be very uncommon.  Bleeding. BEFORE THE PROCEDURE   This surgery may be done while you are asleep (general anesthetic) or may be done under a block where only your forearm and the surgical area is numb.  If the surgery is done under a block, the numbness will gradually wear off within several hours after surgery. HOME CARE INSTRUCTIONS   Have a responsible person with you for 24 hours.  Do not drive a car or use public transportation for 24 hours.  Only take over-the-counter or prescription medicines for pain, discomfort, or fever as directed by your caregiver. Take them as directed.  You may put ice on the palm side of the affected wrist.  Put ice in a plastic bag.  Place a towel between your skin and the bag.  Leave the ice on for 20 to 30 minutes, 4 times per day.  If you were given a splint to keep your wrist from bending, use it as directed. It is important to wear the splint at night or as directed. Use the splint for as long as you have pain or numbness in your hand, arm, or wrist. This may take 1 to 2 months.  Keep your hand raised (elevated) above the level of your heart as much as possible. This keeps swelling down and helps with discomfort.  Change bandages (dressings) as directed.  Keep the wound clean and dry. SEEK MEDICAL CARE IF:   You develop pain not relieved with medications.  You develop numbness of your hand.  You develop bleeding from your surgical site.  You have an oral temperature above 102 F (38.9 C).  You develop redness or swelling of the surgical site.  You develop new, unexplained problems. SEEK IMMEDIATE MEDICAL CARE IF:   You develop a rash.  You have difficulty breathing.  You develop any reaction or side effects to medications given. Document Released: 04/07/2003 Document Revised: 04/09/2011 Document  Reviewed: 11/21/2006 [facility] Patient Information 2015 [place], [place]. This information is not intended to replace advice given to you by your health care provider. Make sure you discuss any questions you have with your health care provider. Sciatica Sciatica is pain, weakness, numbness, or tingling along the path of the sciatic nerve. The nerve starts in the lower back and runs down the back of each leg. The nerve controls the muscles in the lower leg and in the back of the knee, while also providing sensation to the back of the thigh, lower leg, and the sole of your foot. Sciatica is a symptom of another medical condition. For instance, nerve damage or certain conditions, such as a herniated disk or bone spur on the spine, pinch or put pressure on the sciatic nerve. This causes the pain, weakness, or other sensations normally associated with sciatica. Generally, sciatica only affects one side of the body. CAUSES   Herniated or slipped disc.  Degenerative disk disease.  A pain disorder involving the narrow muscle in the buttocks (  piriformis syndrome).  Pelvic injury or fracture.  Pregnancy.  Tumor (rare). SYMPTOMS  Symptoms can vary from mild to very severe. The symptoms usually travel from the low back to the buttocks and down the back of the leg. Symptoms can include:  Mild tingling or dull aches in the lower back, leg, or hip.  Numbness in the back of the calf or sole of the foot.  Burning sensations in the lower back, leg, or hip.  Sharp pains in the lower back, leg, or hip.  Leg weakness.  Severe back pain inhibiting movement. These symptoms may get worse with coughing, sneezing, laughing, or prolonged sitting or standing. Also, being overweight may worsen symptoms. DIAGNOSIS  Your caregiver will perform a physical exam to look for common symptoms of sciatica. He or she may ask you to do certain movements or activities that would trigger sciatic nerve pain. Other tests may be  performed to find the cause of the sciatica. These may include:  Blood tests.  X-rays.  Imaging tests, such as an MRI or CT scan. TREATMENT  Treatment is directed at the cause of the sciatic pain. Sometimes, treatment is not necessary and the pain and discomfort goes away on its own. If treatment is needed, your caregiver may suggest:  Over-the-counter medicines to relieve pain.  Prescription medicines, such as anti-inflammatory medicine, muscle relaxants, or narcotics.  Applying heat or ice to the painful area.  Steroid injections to lessen pain, irritation, and inflammation around the nerve.  Reducing activity during periods of pain.  Exercising and stretching to strengthen your abdomen and improve flexibility of your spine. Your caregiver may suggest losing weight if the extra weight makes the back pain worse.  Physical therapy.  Surgery to eliminate what is pressing or pinching the nerve, such as a bone spur or part of a herniated disk. HOME CARE INSTRUCTIONS   Only take over-the-counter or prescription medicines for pain or discomfort as directed by your caregiver.  Apply ice to the affected area for 20 minutes, 3-4 times a day for the first 48-72 hours. Then try heat in the same way.  Exercise, stretch, or perform your usual activities if these do not aggravate your pain.  Attend physical therapy sessions as directed by your caregiver.  Keep all follow-up appointments as directed by your caregiver.  Do not wear high heels or shoes that do not provide proper support.  Check your mattress to see if it is too soft. A firm mattress may lessen your pain and discomfort. SEEK IMMEDIATE MEDICAL CARE IF:   You lose control of your bowel or bladder (incontinence).  You have increasing weakness in the lower back, pelvis, buttocks, or legs.  You have redness or swelling of your back.  You have a burning sensation when you urinate.  You have pain that gets worse when you  lie down or awakens you at night.  Your pain is worse than you have experienced in the past.  Your pain is lasting longer than 4 weeks.  You are suddenly losing weight without reason. MAKE SURE YOU:  Understand these instructions.  Will watch your condition.  Will get help right away if you are not doing well or get worse. Document Released: 01/09/2001 Document Revised: 07/17/2011 Document Reviewed: 05/27/2011 [facility] Patient Information 2015 [place], [place]. This information is not intended to replace advice given to you by your health care provider. Make sure you discuss any questions you have with your health care provider. Hypokalemia Hypokalemia means that  the amount of potassium in the blood is lower than normal.Potassium is a chemical, called an electrolyte, that helps regulate the amount of fluid in the body. It also stimulates muscle contraction and helps nerves function properly.Most of the body's potassium is inside of cells, and only a very small amount is in the blood. Because the amount in the blood is so small, minor changes can be life-threatening. CAUSES  Antibiotics.  Diarrhea or vomiting.  Using laxatives too much, which can cause diarrhea.  Chronic kidney disease.  Water pills (diuretics).  Eating disorders (bulimia).  Low magnesium level.  Sweating a lot. SIGNS AND SYMPTOMS  Weakness.  Constipation.  Fatigue.  Muscle cramps.  Mental confusion.  Skipped heartbeats or irregular heartbeat (palpitations).  Tingling or numbness. DIAGNOSIS  Your health care provider can diagnose hypokalemia with blood tests. In addition to checking your potassium level, your health care provider may also check other lab tests. TREATMENT Hypokalemia can be treated with potassium supplements taken by mouth or adjustments in your current medicines. If your potassium level is very low, you may need to get potassium through a vein (IV) and be monitored in the  hospital. A diet high in potassium is also helpful. Foods high in potassium are:  Nuts, such as peanuts and pistachios.  Seeds, such as sunflower seeds and pumpkin seeds.  Peas, lentils, and lima beans.  Whole grain and bran cereals and breads.  Fresh fruit and vegetables, such as apricots, avocado, bananas, cantaloupe, kiwi, oranges, tomatoes, asparagus, and potatoes.  Orange and tomato juices.  Red meats.  Fruit yogurt. HOME CARE INSTRUCTIONS  Take all medicines as prescribed by your health care provider.  Maintain a healthy diet by including nutritious food, such as fruits, vegetables, nuts, whole grains, and lean meats.  If you are taking a laxative, be sure to follow the directions on the label. SEEK MEDICAL CARE IF:  Your weakness gets worse.  You feel your heart pounding or racing.  You are vomiting or having diarrhea.  You are diabetic and having trouble keeping your blood glucose in the normal range. SEEK IMMEDIATE MEDICAL CARE IF:  You have chest pain, shortness of breath, or dizziness.  You are vomiting or having diarrhea for more than 2 days.  You faint. MAKE SURE YOU:   Understand these instructions.  Will watch your condition.  Will get help right away if you are not doing well or get worse. Document Released: 01/15/2005 Document Revised: 11/05/2012 Document Reviewed: 07/18/2012 [facility] Patient Information 2015 [place], [place]. This information is not intended to replace advice given to you by your health care provider. Make sure you discuss any questions you have with your health care provider. Eczema Eczema, also called atopic dermatitis, is a skin disorder that causes inflammation of the skin. It causes a red rash and dry, scaly skin. The skin becomes very itchy. Eczema is generally worse during the cooler winter months and often improves with the warmth of summer. Eczema usually starts showing signs in infancy. Some children outgrow eczema, but it  may last through adulthood.  CAUSES  The exact cause of eczema is not known, but it appears to run in families. People with eczema often have a family history of eczema, allergies, asthma, or hay fever. Eczema is not contagious. Flare-ups of the condition may be caused by:   Contact with something you are sensitive or allergic to.   Stress. SIGNS AND SYMPTOMS  Dry, scaly skin.   Red, itchy rash.  Itchiness. This may occur before the skin rash and may be very intense.  DIAGNOSIS  The diagnosis of eczema is usually made based on symptoms and medical history. TREATMENT  Eczema cannot be cured, but symptoms usually can be controlled with treatment and other strategies. A treatment plan might include:  Controlling the itching and scratching.   Use over-the-counter antihistamines as directed for itching. This is especially useful at night when the itching tends to be worse.   Use over-the-counter steroid creams as directed for itching.   Avoid scratching. Scratching makes the rash and itching worse. It may also result in a skin infection (impetigo) due to a break in the skin caused by scratching.   Keeping the skin well moisturized with creams every day. This will seal in moisture and help prevent dryness. Lotions that contain alcohol and water should be avoided because they can dry the skin.   Limiting exposure to things that you are sensitive or allergic to (allergens).   Recognizing situations that cause stress.   Developing a plan to manage stress.  HOME CARE INSTRUCTIONS   Only take over-the-counter or prescription medicines as directed by your health care provider.   Do not use anything on the skin without checking with your health care provider.   Keep baths or showers short (5 minutes) in warm (not hot) water. Use mild cleansers for bathing. These should be unscented. You may add nonperfumed bath oil to the bath water. It is best to avoid soap and bubble  bath.   Immediately after a bath or shower, when the skin is still damp, apply a moisturizing ointment to the entire body. This ointment should be a petroleum ointment. This will seal in moisture and help prevent dryness. The thicker the ointment, the better. These should be unscented.   Keep fingernails cut short. Children with eczema may need to wear soft gloves or mittens at night after applying an ointment.   Dress in clothes made of cotton or cotton blends. Dress lightly, because heat increases itching.   A child with eczema should stay away from anyone with fever blisters or cold sores. The virus that causes fever blisters (herpes simplex) can cause a serious skin infection in children with eczema. SEEK MEDICAL CARE IF:   Your itching interferes with sleep.   Your rash gets worse or is not better within 1 week after starting treatment.   You see pus or soft yellow scabs in the rash area.   You have a fever.   You have a rash flare-up after contact with someone who has fever blisters.  Document Released: 01/13/2000 Document Revised: 11/05/2012 Document Reviewed: 08/18/2012 [facility] Patient Information 2015 [place], [place]. This information is not intended to replace advice given to you by your health care provider. Make sure you discuss any questions you have with your health care provider.

## 2013-10-05 NOTE — Progress Notes (Signed)
° °  Subjective:  This chart was scribed for Elvina Sidle, MD by Carl Best, Medical Scribe. This patient was seen in Room 12 and the patient's care was started at 1:38 PM.   Patient ID: Oscar [name], male    DOB: May 09, 1980, 33 y.o.   MRN: [id number]  HPI HPI Comments: Oscar [name] is a 33 y.o. male who presents to the [facility] for a follow-up and medication refill.  He is still experiencing headache, right sided low back pain, and intermittent numbness in his left wrist.  He went to the emergency room on July 5 for nausea and abdominal pain.  He was discharged with Phenergan and advised to follow-up with his PCP.  He works at a [employer] in [place], [place].    He is also complaining of an itchy, irritated area of skin on his left hand.  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  He applied Cortisone cream with no relief to his symptoms.     No past medical history on file. No past surgical history on file. No family history on file. History   Social History   Marital Status: Married    Spouse Name: N/A    Number of Children: N/A   Years of Education: N/A   Occupational History   Not on file.   Social History Main Topics   Smoking status: Never Smoker    Smokeless tobacco: Not on file   Alcohol Use: Yes   Drug Use: No   Sexual Activity: Yes   Other Topics Concern   Not on file   Social History Narrative   No narrative on file   No Known Allergies  Review of Systems  Musculoskeletal: Positive for back pain.  Neurological: Positive for numbness and headaches.     Objective:  Physical Exam  Nursing note and vitals reviewed. Constitutional: He is oriented to person, place, and time. He appears well-developed and well-nourished.  HENT:  Head: Normocephalic and atraumatic.  Eyes: EOM are normal.  Neck: Normal range of motion.  Cardiovascular: Normal rate.   Pulmonary/Chest: Effort normal.  Abdominal: Soft. Bowel sounds are normal.  There is no tenderness.  Musculoskeletal: Normal range of motion.  Positive straight leg raise.   Neurological: He is alert and oriented to person, place, and time.  Skin: Skin is warm and dry.  Eczematous change.  Psychiatric: He has a normal mood and affect. His behavior is normal.     BP 120/76   Pulse 62   Temp(Src) 98 F (36.7 C) (Oral)   Resp 16   Ht  (1.549 m)   Wt 140 lb 6.4 oz (63.685 kg)   BMI 26.54 kg/m2   SpO2 98% Assessment & Plan:  I personally performed the services described in this documentation, which was scribed in my presence. The recorded information has been reviewed and is accurate.  Myalgia  Carpal tunnel syndrome of left wrist - Plan: methylPREDNIsolone (MEDROL DOSPACK) 4 MG tablet  Hyperlipemia - Plan: simvastatin (ZOCOR) 20 MG tablet  Hypokalemia - Plan: potassium chloride SA (K-DUR,KLOR-CON) 20 MEQ tablet  Sciatica, left - Plan: methylPREDNIsolone (MEDROL DOSPACK) 4 MG tablet, MR Lumbar Spine Wo Contrast  Eczema - Plan: fluocinonide-emollient (LIDEX-E) 0.05 % cream  Signed, Elvina Sidle, MD

## 2013-10-06 ENCOUNTER — Telehealth: Payer: Self-pay

## 2013-10-06 ENCOUNTER — Ambulatory Visit (INDEPENDENT_AMBULATORY_CARE_PROVIDER_SITE_OTHER): Payer: BC Managed Care – PPO | Admitting: Family Medicine

## 2013-10-06 VITALS — BP 118/78 | HR 64 | Temp 98.0°F | Resp 16 | Ht 61.25 in | Wt 142.2 lb

## 2013-10-06 DIAGNOSIS — Z79899 Other long term (current) drug therapy: Secondary | ICD-10-CM

## 2013-10-06 DIAGNOSIS — E876 Hypokalemia: Secondary | ICD-10-CM

## 2013-10-06 DIAGNOSIS — B9681 Helicobacter pylori [H. pylori] as the cause of diseases classified elsewhere: Secondary | ICD-10-CM

## 2013-10-06 DIAGNOSIS — R079 Chest pain, unspecified: Secondary | ICD-10-CM

## 2013-10-06 DIAGNOSIS — K219 Gastro-esophageal reflux disease without esophagitis: Secondary | ICD-10-CM

## 2013-10-06 DIAGNOSIS — K2940 Chronic atrophic gastritis without bleeding: Secondary | ICD-10-CM

## 2013-10-06 DIAGNOSIS — R7309 Other abnormal glucose: Secondary | ICD-10-CM

## 2013-10-06 DIAGNOSIS — A048 Other specified bacterial intestinal infections: Secondary | ICD-10-CM

## 2013-10-06 DIAGNOSIS — E785 Hyperlipidemia, unspecified: Secondary | ICD-10-CM

## 2013-10-06 DIAGNOSIS — K2970 Gastritis, unspecified, without bleeding: Secondary | ICD-10-CM

## 2013-10-06 LAB — BASIC METABOLIC PANEL
BUN: 14 mg/dL (ref 6–23)
CHLORIDE: 100 meq/L (ref 96–112)
CO2: 28 mEq/L (ref 19–32)
Calcium: 10.7 mg/dL — ABNORMAL HIGH (ref 8.4–10.5)
Creat: 0.8 mg/dL (ref 0.50–1.35)
GLUCOSE: 88 mg/dL (ref 70–99)
Potassium: 4.9 mEq/L (ref 3.5–5.3)
Sodium: 139 mEq/L (ref 135–145)

## 2013-10-06 LAB — POCT CBC
Granulocyte percent: 74.8 %G (ref 37–80)
HCT, POC: 49.1 % (ref 43.5–53.7)
HEMOGLOBIN: 16.7 g/dL (ref 14.1–18.1)
LYMPH, POC: 2.3 (ref 0.6–3.4)
MCH: 29.1 pg (ref 27–31.2)
MCHC: 34 g/dL (ref 31.8–35.4)
MCV: 85.6 fL (ref 80–97)
MID (cbc): 0.2 (ref 0–0.9)
MPV: 8.3 fL (ref 0–99.8)
POC Granulocyte: 7.5 — AB (ref 2–6.9)
POC LYMPH PERCENT: 22.9 %L (ref 10–50)
POC MID %: 2.3 %M (ref 0–12)
Platelet Count, POC: 308 10*3/uL (ref 142–424)
RBC: 5.73 M/uL (ref 4.69–6.13)
RDW, POC: 13.2 %
WBC: 10 10*3/uL (ref 4.6–10.2)

## 2013-10-06 LAB — TSH: TSH: 0.584 u[IU]/mL (ref 0.350–4.500)

## 2013-10-06 LAB — POCT GLYCOSYLATED HEMOGLOBIN (HGB A1C): Hemoglobin A1C: 5.3

## 2013-10-06 LAB — LIPASE: LIPASE: 14 U/L (ref 0–75)

## 2013-10-06 LAB — POCT SEDIMENTATION RATE: POCT SED RATE: 29 mm/hr — AB (ref 0–22)

## 2013-10-06 MED ORDER — RANITIDINE HCL 150 MG PO TABS: 150.0000 mg | ORAL_TABLET | Freq: Two times a day (BID) | ORAL | Status: DC

## 2013-10-06 MED ORDER — MELOXICAM 15 MG PO TABS: 15.0000 mg | ORAL_TABLET | Freq: Every day | ORAL | Status: DC

## 2013-10-06 MED ORDER — GI COCKTAIL ~~LOC~~
30.0000 mL | Freq: Once | ORAL | Status: AC
  Administered 2013-10-06: 30 mL via ORAL

## 2013-10-06 NOTE — Progress Notes (Signed)
Subjective:    Patient ID: Oscar [name], male    DOB: 05-Jul-1980, 33 y.o.   MRN: [id number] Chief Complaint  Patient presents with  . Chest Pain    pt states he took some meds last night and his heart felt as if it was on fire    HPI  He took the cholesterol and potassium pill and then 15 min later at left-sided chest pain - felt hot - like heart burn and cramping/tightness as if a hand was squeezing it - like heartburn if he had eaten spicy foods - fell asleep last night when he woke up was tingling and then this morning he took the medrol and came here - was a little dizzy this morning with walking and  No palpitations or heart racing but sometimes it can cause slight trouble breathing. Didn't really do anything yesterday, no activity that would have triggered this.  Does get some exercise daily by walking in the mail. Has carpal tunnel in his left hand Was in the ER prev and told that his sxs were due to his stomach pain but he is not sure about that - has been careful with his diet. Has had a bulging disc since 2007 - was seen at [facility] in 2010 when this was diagnosed - did PT which helped.  Since then has been really trying to not do heavy lifting but Takes advil 2-3 tabs whenever he has pain several times/wk and has noticed that he has some burning, stomach aches, boryboregmi, distention in his stomach.   Occ takes Tums w/o help - did try zantac 150 1 tab several years ago and doesn't really remember if it helps. 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  AFTER GI Cocktail - chest sxs completely resolved but does continue to have some LUQ pain, some low back pain that sometimes causes him pain, and some  lightheadedness/dizziness w/ standing.  History reviewed. No pertinent past medical history. Current Outpatient Prescriptions on File Prior to Visit  Medication Sig Dispense Refill  . fluocinonide-emollient (LIDEX-E) 0.05 % cream Apply 1 application topically 2 (two) times daily.  30 g  0  . methylPREDNIsolone (MEDROL DOSPACK) 4 MG tablet follow package directions  21 tablet  0  . simvastatin (ZOCOR) 20 MG tablet Take 1 tablet (20 mg total) by mouth at bedtime.  90 tablet  3   No current facility-administered medications on file prior to visit.   No Known Allergies  Review of Systems  Constitutional: Negative for fever and chills.  Eyes: Negative for visual disturbance.  Respiratory: Positive for chest tightness and shortness of breath. Negative for cough and wheezing.   Cardiovascular: Positive for chest pain. Negative for palpitations and leg swelling.  Gastrointestinal: Positive for abdominal pain and abdominal distention. Negative for nausea, vomiting, diarrhea and constipation.  Musculoskeletal: Positive for back pain and myalgias. Negative for arthralgias, gait problem and joint swelling.  Skin: Negative for rash.  Neurological: Positive for weakness, light-headedness, numbness and headaches. Negative for dizziness, syncope and facial asymmetry.  Hematological: Negative for adenopathy.  Psychiatric/Behavioral: Negative for sleep disturbance.  Objective:  BP 118/78  Pulse 64  Temp(Src) 98 F (36.7 C) (Oral)  Resp 16  Ht 5' 1.25" (1.556 m)  Wt 142 lb 3.2 oz (64.501 kg)  BMI 26.64 kg/m2  SpO2 97%  Physical Exam  Constitutional: He is oriented to person, place, and time. He appears well-developed and well-nourished. No distress.  HENT:  Head: Normocephalic and atraumatic.  Eyes: Conjunctivae are normal. Pupils are equal, round, and reactive to light. No scleral icterus.  Neck: Normal range of motion. Neck supple. No thyromegaly present.  Cardiovascular: Normal rate,  regular rhythm, normal heart sounds and intact distal pulses.   Pulmonary/Chest: Effort normal and breath sounds normal. No respiratory distress.  Abdominal: Soft. Normal appearance and bowel sounds are normal. He exhibits no distension and no mass. There is no hepatosplenomegaly. There is generalized tenderness. There is no rebound, no guarding and no CVA tenderness.  Musculoskeletal: He exhibits no edema.  Lymphadenopathy:    He has no cervical adenopathy.  Neurological: He is alert and oriented to person, place, and time.  Skin: Skin is warm and dry. He is not diaphoretic.  Psychiatric: He has a normal mood and affect. His behavior is normal.   CP resolved after GI cocktail in office though then developed mild orthostatic sxs   [facility] reading (PRIMARY) by  Dr. Clelia Croft. EKG: flipped Ts in lead 3 and aVF, elevated J point in chest leads  Orthostatics VS: NEG    Results for orders placed in visit on 10/06/13  POCT CBC      Result Value Ref Range   WBC 10.0  4.6 - 10.2 K/uL   Lymph, poc 2.3  0.6 - 3.4   POC LYMPH PERCENT 22.9  10 - 50 %L   MID (cbc) 0.2  0 - 0.9   POC MID % 2.3  0 - 12 %M   POC Granulocyte 7.5 (*) 2 - 6.9   Granulocyte percent 74.8  37 - 80 %G   RBC 5.73  4.69 - 6.13 M/uL   Hemoglobin 16.7  14.1 - 18.1 g/dL   HCT, POC 16.1  09.6 - 53.7 %   MCV 85.6  80 - 97 fL   MCH, POC 29.1  27 - 31.2 pg   MCHC 34.0  31.8 - 35.4 g/dL   RDW, POC 04.5     Platelet Count, POC 308  142 - 424 K/uL   MPV 8.3  0 - 99.8 fL  POCT GLYCOSYLATED HEMOGLOBIN (HGB A1C)      Result Value Ref Range   Hemoglobin A1C 5.3      Assessment & Plan:   Chest pain, unspecified - Plan: POCT CBC, POCT SEDIMENTATION RATE, Basic metabolic panel, EKG 12-Lead, gi cocktail (Maalox,Lidocaine,Donnatal), H. pylori antibody, IgG, TSH, Lipase  Elevated glucose - Plan: POCT glycosylated hemoglobin (Hb A1C) - nml  Other and unspecified hyperlipidemia - started on zocor yest - cont and RTC for lfts in 8 wks w/  flp in 4-6 mos.  Gastroesophageal reflux disease, esophagitis presence not specified - start bid ranitidine - suspect this as cause of chest sxs due to freq nsaid use and recent prednisone courses due to back pain and stress.  Modify diet - try low acid coffee  Hypokalemia - recheck - stop K for now and try high K diet - foods given  Encounter for long-term (current) use of other medications  Carpal tunnel syndrome - cont wearing brace at night - rec referral to hand surg for injections or  surg as developing weakness/numbness - declines for now as difficult to get time off work but will recons in future.  Meds ordered this encounter  Medications  . gi cocktail (Maalox,Lidocaine,Donnatal)    Sig:   . ranitidine (ZANTAC) 150 MG tablet    Sig: Take 1 tablet (150 mg total) by mouth 2 (two) times daily.    Dispense:  60 tablet    Refill:  1  . meloxicam (MOBIC) 15 MG tablet    Sig: Take 1 tablet (15 mg total) by mouth daily.    Dispense:  30 tablet    Refill:  1    Norberto Sorenson, MD MPH    Over 40 min spent in face-to-face evaluation of and consultation with patient and coordination of care.

## 2013-10-06 NOTE — Telephone Encounter (Signed)
Spoke to pt- he is feeling a tightness in his chest every once in a while- advised pt to RTC or go to ED immediatly.

## 2013-10-06 NOTE — Telephone Encounter (Signed)
Please call [facility] at [phone] and give more clinical information   For id number [id number]   Mri l spine without

## 2013-10-06 NOTE — Telephone Encounter (Signed)
Pt came in to see Dr Kenyon Ana and stated didn't sleep well last night, isn't feeling any better, advised pt to come back in but he would like a call back at [phone]

## 2013-10-06 NOTE — Telephone Encounter (Signed)
[id number] Valid for 30 days- [facility] as Imaging facility.

## 2013-10-06 NOTE — Patient Instructions (Addendum)
Stop taking your potassium pills unless otherwise instructed by a doctor.   Do not use with any other otc pain medication other than tylenol/acetaminophen - so no aleve, ibuprofen, motrin, advil, etc.   Potassium Content of Foods Potassium is a mineral found in many foods and drinks. It helps keep fluids and minerals balanced in your body and affects how steadily your heart beats. Potassium also helps control your blood pressure and keep your muscles and nervous system healthy. Certain health conditions and medicines may change the balance of potassium in your body. When this happens, you can help balance your level of potassium through the foods that you do or do not eat. Your health care provider or dietitian may recommend an amount of potassium that you should have each day. The following lists of foods provide the amount of potassium (in parentheses) per serving in each item. HIGH IN POTASSIUM  The following foods and beverages have 200 mg or more of potassium per serving:  Apricots, 2 raw or 5 dry (200 mg).  Artichoke, 1 medium (345 mg).  Avocado, raw,  each (245 mg).  Banana, 1 medium (425 mg).  Beans, lima, or baked beans, canned,  cup (280 mg).  Beans, white, canned,  cup (595 mg).  Beef roast, 3 oz (320 mg).  Beef, ground, 3 oz (270 mg).  Beets, raw or cooked,  cup (260 mg).  Bran muffin, 2 oz (300 mg).  Broccoli,  cup (230 mg).  Brussels sprouts,  cup (250 mg).  Cantaloupe,  cup (215 mg).  Cereal, 100% bran,  cup (200-400 mg).  Cheeseburger, single, fast food, 1 each (225-400 mg).  Chicken, 3 oz (220 mg).  Clams, canned, 3 oz (535 mg).  Crab, 3 oz (225 mg).  Dates, 5 each (270 mg).  Dried beans and peas,  cup (300-475 mg).  Figs, dried, 2 each (260 mg).  Fish: halibut, tuna, cod, snapper, 3 oz (480 mg).  Fish: salmon, haddock, swordfish, perch, 3 oz (300 mg).  Fish, tuna, canned 3 oz (200 mg).  [place] fries, fast food, 3 oz (470  mg).  Granola with fruit and nuts,  cup (200 mg).  Grapefruit juice,  cup (200 mg).  Greens, beet,  cup (655 mg).  Honeydew melon,  cup (200 mg).  Kale, raw, 1 cup (300 mg).  Kiwi, 1 medium (240 mg).  Kohlrabi, rutabaga, parsnips,  cup (280 mg).  Lentils,  cup (365 mg).  Mango, 1 each (325 mg).  Milk, chocolate, 1 cup (420 mg).  Milk: nonfat, low-fat, whole, buttermilk, 1 cup (350-380 mg).  Molasses, 1 Tbsp (295 mg).  Mushrooms,  cup (280) mg.  Nectarine, 1 each (275 mg).  Nuts: almonds, peanuts, hazelnuts, [place], cashew, mixed, 1 oz (200 mg).  Nuts, pistachios, 1 oz (295 mg).  Orange, 1 each (240 mg).  Orange juice,  cup (235 mg).  Papaya, medium,  fruit (390 mg).  Peanut butter, chunky, 2 Tbsp (240 mg).  Peanut butter, smooth, 2 Tbsp (210 mg).  Pear, 1 medium (200 mg).  Pomegranate, 1 whole (400 mg).  Pomegranate juice,  cup (215 mg).  Pork, 3 oz (350 mg).  Potato chips, salted, 1 oz (465 mg).  Potato, baked with skin, 1 medium (925 mg).  Potatoes, boiled,  cup (255 mg).  Potatoes, mashed,  cup (330 mg).  Prune juice,  cup (370 mg).  Prunes, 5 each (305 mg).  Pudding, chocolate,  cup (230 mg).  Pumpkin, canned,  cup (250 mg).  Raisins,  seedless,  cup (270 mg).  Seeds, sunflower or pumpkin, 1 oz (240 mg).  Soy milk, 1 cup (300 mg).  Spinach,  cup (420 mg).  Spinach, canned,  cup (370 mg).  Sweet potato, baked with skin, 1 medium (450 mg).  Swiss chard,  cup (480 mg).  Tomato or vegetable juice,  cup (275 mg).  Tomato sauce or puree,  cup (400-550 mg).  Tomato, raw, 1 medium (290 mg).  Tomatoes, canned,  cup (200-300 mg).  [place], 3 oz (250 mg).  Wheat germ, 1 oz (250 mg).  Winter squash,  cup (250 mg).  Yogurt, plain or fruited, 6 oz (260-435 mg).  Zucchini,  cup (220 mg). MODERATE IN POTASSIUM The following foods and beverages have 50-200 mg of potassium per serving:  Apple, 1 each (150  mg).  Apple juice,  cup (150 mg).  Applesauce,  cup (90 mg).  Apricot nectar,  cup (140 mg).  Asparagus, small spears,  cup or 6 spears (155 mg).  Bagel, cinnamon raisin, 1 each (130 mg).  Bagel, egg or plain, 4 in., 1 each (70 mg).  Beans, green,  cup (90 mg).  Beans, yellow,  cup (190 mg).  Beer, regular, 12 oz (100 mg).  Beets, canned,  cup (125 mg).  Blackberries,  cup (115 mg).  Blueberries,  cup (60 mg).  Bread, whole wheat, 1 slice (70 mg).  Broccoli, raw,  cup (145 mg).  Cabbage,  cup (150 mg).  Carrots, cooked or raw,  cup (180 mg).  Cauliflower, raw,  cup (150 mg).  Celery, raw,  cup (155 mg).  Cereal, bran flakes, cup (120-150 mg).  Cheese, cottage,  cup (110 mg).  Cherries, 10 each (150 mg).  Chocolate, 1 oz bar (165 mg).  Coffee, brewed 6 oz (90 mg).  Corn,  cup or 1 ear (195 mg).  Cucumbers,  cup (80 mg).  Egg, large, 1 each (60 mg).  Eggplant,  cup (60 mg).  Endive, raw, cup (80 mg).  English muffin, 1 each (65 mg).  Fish, orange roughy, 3 oz (150 mg).  Frankfurter, beef or pork, 1 each (75 mg).  Fruit cocktail,  cup (115 mg).  Grape juice,  cup (170 mg).  Grapefruit,  fruit (175 mg).  Grapes,  cup (155 mg).  Greens: kale, turnip, collard,  cup (110-150 mg).  Ice cream or frozen yogurt, chocolate,  cup (175 mg).  Ice cream or frozen yogurt, vanilla,  cup (120-150 mg).  Lemons, limes, 1 each (80 mg).  Lettuce, all types, 1 cup (100 mg).  Mixed vegetables,  cup (150 mg).  Mushrooms, raw,  cup (110 mg).  Nuts: walnuts, pecans, or macadamia, 1 oz (125 mg).  Oatmeal,  cup (80 mg).  Okra,  cup (110 mg).  Onions, raw,  cup (120 mg).  Peach, 1 each (185 mg).  Peaches, canned,  cup (120 mg).  Pears, canned,  cup (120 mg).  Peas, green, frozen,  cup (90 mg).  Peppers, green,  cup (130 mg).  Peppers, red,  cup (160 mg).  Pineapple juice,  cup (165 mg).  Pineapple,  fresh or canned,  cup (100 mg).  Plums, 1 each (105 mg).  Pudding, vanilla,  cup (150 mg).  Raspberries,  cup (90 mg).  Rhubarb,  cup (115 mg).  Rice, wild,  cup (80 mg).  Shrimp, 3 oz (155 mg).  Spinach, raw, 1 cup (170 mg).  Strawberries,  cup (125 mg).  Summer squash  cup (175-200 mg).  Swiss  chard, raw, 1 cup (135 mg).  Tangerines, 1 each (140 mg).  Tea, brewed, 6 oz (65 mg).  Turnips,  cup (140 mg).  Watermelon,  cup (85 mg).  Wine, red, table, 5 oz (180 mg).  Wine, white, table, 5 oz (100 mg). LOW IN POTASSIUM The following foods and beverages have less than 50 mg of potassium per serving.  Bread, white, 1 slice (30 mg).  Carbonated beverages, 12 oz (less than 5 mg).  Cheese, 1 oz (20-30 mg).  Cranberries,  cup (45 mg).  Cranberry juice cocktail,  cup (20 mg).  Fats and oils, 1 Tbsp (less than 5 mg).  Hummus, 1 Tbsp (32 mg).  Nectar: papaya, mango, or pear,  cup (35 mg).  Rice, white or brown,  cup (50 mg).  Spaghetti or macaroni,  cup cooked (30 mg).  Tortilla, flour or corn, 1 each (50 mg).  Waffle, 4 in., 1 each (50 mg).  Water chestnuts,  cup (40 mg). Document Released: 08/29/2004 Document Revised: 01/20/2013 Document Reviewed: 12/12/2012 [facility] Patient Information 2015 [place], [place]. This information is not intended to replace advice given to you by your health care provider. Make sure you discuss any questions you have with your health care provider. Food Choices for Gastroesophageal Reflux Disease When you have gastroesophageal reflux disease (GERD), the foods you eat and your eating habits are very important. Choosing the right foods can help ease the discomfort of GERD. WHAT GENERAL GUIDELINES DO I NEED TO FOLLOW?  Choose fruits, vegetables, whole grains, low-fat dairy products, and low-fat meat, fish, and poultry.  Limit fats such as oils, salad dressings, butter, nuts, and avocado.  Keep a food diary to  identify foods that cause symptoms.  Avoid foods that cause reflux. These may be different for different people.  Eat frequent small meals instead of three large meals each day.  Eat your meals slowly, in a relaxed setting.  Limit fried foods.  Cook foods using methods other than frying.  Avoid drinking alcohol.  Avoid drinking large amounts of liquids with your meals.  Avoid bending over or lying down until 2-3 hours after eating. WHAT FOODS ARE NOT RECOMMENDED? The following are some foods and drinks that may worsen your symptoms: Vegetables Tomatoes. Tomato juice. Tomato and spaghetti sauce. Chili peppers. Onion and garlic. Horseradish. Fruits Oranges, grapefruit, and lemon (fruit and juice). Meats High-fat meats, fish, and poultry. This includes hot dogs, ribs, ham, sausage, salami, and bacon. Dairy Whole milk and chocolate milk. Sour cream. Cream. Butter. Ice cream. Cream cheese.  Beverages Coffee and tea, with or without caffeine. Carbonated beverages or energy drinks. Condiments Hot sauce. Barbecue sauce.  Sweets/Desserts Chocolate and cocoa. Donuts. Peppermint and spearmint. Fats and Oils High-fat foods, including [place] fries and potato chips. Other Vinegar. Strong spices, such as black pepper, white pepper, red pepper, cayenne, curry powder, cloves, ginger, and chili powder. The items listed above may not be a complete list of foods and beverages to avoid. Contact your dietitian for more information. Document Released: 01/15/2005 Document Revised: 01/20/2013 Document Reviewed: 11/19/2012 [facility] Patient Information 2015 [place], [place]. This information is not intended to replace advice given to you by your health care provider. Make sure you discuss any questions you have with your health care provider. B?nh Tro Ng??c D? Dy Th?c Qu?n, Ng??i L?n (Gastroesophageal Reflux Diseaes, Adult) B?nh tro ng??c d? dy th?c qu?n (GERD) x?y ra khi axit t? d? dy tro ln  th?c qu?n. Khi axit ti?p xc v?i th?c qu?n,  axit gy ra ?au (vim) trong th?c qu?n. Theo th?i gian, GERD c th? t?o ra cc l? nh? (cc v?t lot) ? nim m?c th?c qu?n.  NGUYN NHN  Tr?ng l??ng c? th? t?ng. ?i?u ny t?o p l?c ln d? dy, lm t?ng axit t? d? dy vo th?c qu?n.  Ht thu?c l. Ht thu?c l lm t?ng s?n sinh axit trong d? dy.  U?ng r??u. ?y l nguyn nhn lm gi?m p l?c trong c? th?t th?c qu?n d??i (van ho?c vng c? gi?a th?c qu?n v d? dy), cho php axit t? d? dy vo th?c qu?n.  ?n t?i mu?n v b?ng no. Tnh tr?ng ny lm t?ng p l?c c?ng nh? t?ng s?n sinh axit trong d? dy.  D? t?t c? th?t th?c qu?n d??i. ?i khi khng tm th?y nguyn nhn. TRI?U CH?NG  ?au rt ? ph?n d??i gi?a ng?c pha sau x??ng ?c v ? khu v?c gi?a d? dy. Hi?n t??ng ny c th? x?y ra hai l?n m?t tu?n ho?c th??ng xuyn h?n.  Kh nu?t.  ?au h?ng.  Ho khan.  Cc tri?u ch?ng gi?ng hen suy?n, bao g?m t?c ng?c,kh th? ho?c th? kh kh. CH?N ?ON Chuyn gia ch?m [place] s?c kh?e c th? ch?n ?on GERD d?a trn cc tri?u ch?ng c?a b?n. Trong m?t s? tr??ng h?p, ch?p X quang v cc xt nghi?m khc c th? ???c ti?n hnh ?? ki?m tra cc bi?n ch?ng ho?c tnh tr?ng c?a d? dy v th?c qu?n. ?I?U TR? Chuyn gia ch?m [place] s?c kh?e c th? khuy?n ngh? dng thu?c khng c?n k ??n ho?c thu?c c?n k ??n ?? gip gi?m s?n sinh axit. Hy h?i chuyn gia ch?m [place] s?c kh?e c?a b?n tr??c khi b?t ??u ho?c dng thm b?t k? lo?i thu?c m?i no. H??NG D?N CH?M [place] T?I NH  Thay ??i cc y?u t? m b?n c th? ki?m sot ???c. H?i chuyn gia ch?m [place] s?c kh?e ?? ???c h??ng d?n v? vi?c gi?m cn, b? thu?c l v s? d?ng r??u.  [place] cc lo?i th?c ph?m v ?? u?ng lm cho cc tri?u ch?ng t?i t? h?n, ch?ng h?n nh?:  ?? u?ng c caffeine ho?c r??u.  S c la.  B?c h ho?c v? b?c h.  T?i v hnh ty.  Th?c ?n [place].  Tri cy h? cam, ch?ng h?n nh? cam, chanh hay chanh ty.  Cc th?c ?n c c chua, ch?ng h?n nh? n??c x?t, ?t, salsa  (n??c x?t [place]) v bnh pizza.  Cc lo?i th?c ?n chin xo v nhi?u ch?t bo.  Trnh n?m xu?ng ng? 3 ti?ng tr??c gi? ?i ng? ho?c tr??c khi c m?t gi?c ng? ng?n.  ?n nh?ng b?a ?n nh?, th??ng xuyn h?n thay v cc b?a ?n l?n.  M?c qu?n o r?ng. Khng ?eo b?t c? th? g ch?t quanh th?t l?ng gy p l?c ln d? dy.  Nng ??u gi??ng cao ln t? 6 ??n 8 inch b?ng cc kh?i g? ?? gip b?n ng?. S? d?ng thm g?i s? khng c tc d?ng.  Ch? s? d?ng thu?c khng c?n k ??n ho?c thu?c c?n k ??n ?? gi?m ?au, gi?m c?m gic kh ch?u ho?c h? s?t theo ch? d?n c?a chuyn gia ch?m [facility] s?c kh?e c?a b?n.  Khng dng thu?c atpirin, ibuprofen ho?c cc thu?c ch?ng vim khng c steroid (NSAID) khc. HY NGAY L?P T?C ?I KHM N?U:  B?n b? ?au ? cnh tay, c?, hm, r?ng ho?c l?ng.  Hi?n t??ng ?au  t?ng ln ho?c thay ??i theo c??ng ?? ho?c th?i gian.  B?n b? bu?n nn, nn ho?c ?? m? hi (tot m? hi).  B?n b? kh th? ho?c ng?t x?u.  Ch?t nn c mu xanh l cy, vng, ?en ho?c trng gi?ng nh? b c ph ho?c mu.  Phn c mu ??, ?? nh? mu ho?c ?en. Nh?ng tri?u ch?ng ny c th? l d?u hi?u c?a cc v?n ?? khc, ch?ng h?n nh? b?nh tim, ch?y mu d? dy ho?c ch?y mu th?c qu?n. ??M B?O B?N:  Hi?u cc h??ng d?n ny.  S? theo di tnh tr?ng c?a mnh.  S? yu c?u tr? gip ngay l?p t?c n?u b?n c?m th?y khng ?? ho?c tnh tr?ng tr?m tr?ng h?n. Document Released: 10/25/2004 Document Revised: 09/17/2012 [facility] Patient Information 2015 [place], [place]. This information is not intended to replace advice given to you by your health care provider. Make sure you discuss any questions you have with your health care provider. Gastroesophageal Reflux Disease, Adult Gastroesophageal reflux disease (GERD) happens when acid from your stomach flows up into the esophagus. When acid comes in contact with the esophagus, the acid causes soreness (inflammation) in the esophagus. Over time, GERD may create small holes (ulcers) in the lining  of the esophagus. CAUSES   Increased body weight. This puts pressure on the stomach, making acid rise from the stomach into the esophagus.  Smoking. This increases acid production in the stomach.  Drinking alcohol. This causes decreased pressure in the lower esophageal sphincter (valve or ring of muscle between the esophagus and stomach), allowing acid from the stomach into the esophagus.  Late evening meals and a full stomach. This increases pressure and acid production in the stomach.  A malformed lower esophageal sphincter. Sometimes, no cause is found. SYMPTOMS   Burning pain in the lower part of the mid-chest behind the breastbone and in the mid-stomach area. This may occur twice a week or more often.  Trouble swallowing.  Sore throat.  Dry cough.  Asthma-like symptoms including chest tightness, shortness of breath, or wheezing. DIAGNOSIS  Your caregiver may be able to diagnose GERD based on your symptoms. In some cases, X-rays and other tests may be done to check for complications or to check the condition of your stomach and esophagus. TREATMENT  Your caregiver may recommend over-the-counter or prescription medicines to help decrease acid production. Ask your caregiver before starting or adding any new medicines.  HOME CARE INSTRUCTIONS   Change the factors that you can control. Ask your caregiver for guidance concerning weight loss, quitting smoking, and alcohol consumption.  Avoid foods and drinks that make your symptoms worse, such as:  Caffeine or alcoholic drinks.  Chocolate.  Peppermint or mint flavorings.  Garlic and onions.  Spicy foods.  Citrus fruits, such as oranges, lemons, or limes.  Tomato-based foods such as sauce, chili, salsa, and pizza.  Fried and fatty foods.  Avoid lying down for the 3 hours prior to your bedtime or prior to taking a nap.  Eat small, frequent meals instead of large meals.  Wear loose-fitting clothing. Do not wear  anything tight around your waist that causes pressure on your stomach.  Raise the head of your bed 6 to 8 inches with wood blocks to help you sleep. Extra pillows will not help.  Only take over-the-counter or prescription medicines for pain, discomfort, or fever as directed by your caregiver.  Do not take aspirin, ibuprofen, or other nonsteroidal anti-inflammatory drugs (NSAIDs). SEEK IMMEDIATE MEDICAL  CARE IF:   You have pain in your arms, neck, jaw, teeth, or back.  Your pain increases or changes in intensity or duration.  You develop nausea, vomiting, or sweating (diaphoresis).  You develop shortness of breath, or you faint.  Your vomit is green, yellow, black, or looks like coffee grounds or blood.  Your stool is red, bloody, or black. These symptoms could be signs of other problems, such as heart disease, gastric bleeding, or esophageal bleeding. MAKE SURE YOU:   Understand these instructions.  Will watch your condition.  Will get help right away if you are not doing well or get worse. Document Released: 10/25/2004 Document Revised: 04/09/2011 Document Reviewed: 08/04/2010 [facility] Patient Information 2015 [place], [place]. This information is not intended to replace advice given to you by your health care provider. Make sure you discuss any questions you have with your health care provider.

## 2013-10-07 LAB — H. PYLORI ANTIBODY, IGG: H Pylori IgG: 6.25 {ISR} — ABNORMAL HIGH

## 2013-10-07 MED ORDER — CLARITHROMYCIN 500 MG PO TABS: 500.0000 mg | ORAL_TABLET | Freq: Two times a day (BID) | ORAL | Status: DC

## 2013-10-07 MED ORDER — AMOXICILLIN 500 MG PO CAPS: 1000.0000 mg | ORAL_CAPSULE | Freq: Two times a day (BID) | ORAL | Status: DC

## 2013-10-07 MED ORDER — OMEPRAZOLE 40 MG PO CPDR: 40.0000 mg | DELAYED_RELEASE_CAPSULE | Freq: Two times a day (BID) | ORAL | Status: DC

## 2013-10-07 NOTE — Telephone Encounter (Signed)
Sent to [facility] with authorization of [id number]

## 2013-10-07 NOTE — Addendum Note (Signed)
Addended by: Norberto Sorenson on: 10/07/2013 09:12 PM   Modules accepted: Orders, Medications

## 2013-10-12 ENCOUNTER — Inpatient Hospital Stay: Admission: RE | Admit: 2013-10-12 | Payer: BC Managed Care – PPO | Source: Ambulatory Visit

## 2013-10-13 ENCOUNTER — Telehealth: Payer: Self-pay

## 2013-10-13 NOTE — Telephone Encounter (Signed)
Pharm faxed drug interaction alert between clarithromycin and simvastatin, severity level: Major, with delayed onset. Pharmacologic and toxic effects of simvastatin may be increased by clarith and may inc risk of liver dysf and rhabdomyolysis. Dr Clelia Croft, [place] Child[place], please advise.

## 2013-10-13 NOTE — Telephone Encounter (Signed)
Please have patient STOP his simvastatin while he is on the clarithromycin  He can resume the simvastatin a week after the clarithromycin course is complete. Thanks, ES

## 2013-10-15 ENCOUNTER — Telehealth: Payer: Self-pay

## 2013-10-15 DIAGNOSIS — M5432 Sciatica, left side: Secondary | ICD-10-CM

## 2013-10-15 DIAGNOSIS — G5602 Carpal tunnel syndrome, left upper limb: Secondary | ICD-10-CM

## 2013-10-15 NOTE — Telephone Encounter (Signed)
Patient is hard to understand over phone and is requesting a referral for physical therapy. He says he only had one for last year and now his carpel tunnel is causing him a lot of pain. He also wants a refill on his pain medication but he says he doesn't know the name except it starts with  N A.   Best: [phone]

## 2013-10-15 NOTE — Telephone Encounter (Signed)
Pt

## 2013-10-16 NOTE — Telephone Encounter (Signed)
Please advise if pt may have a refill of Medrol Dose pak until PT appt is made. Pt information was sent to [facility]. Referrals can we follow up to find out if they have been able to contact them.

## 2013-10-16 NOTE — Telephone Encounter (Signed)
Patient requesting a refill on "Methylprednisone" to be sent to [facility] pharmacy on [address]. Per patient he is having a lot of pain in his left wrist from his carpal tunnel. Patients call back number is [phone]

## 2013-10-17 MED ORDER — METHYLPREDNISOLONE (PAK) 4 MG PO TABS: ORAL_TABLET | ORAL | Status: DC

## 2013-10-17 NOTE — Telephone Encounter (Signed)
Spoke with pt. Let him know that the Medrol Dose Pak was sent in. Looks like we sent everything over to Dr. Orpha Bur office and they were supposed to call pt with an appt. He has not heard from them. Gave pt their office information and he is going to contact them Monday morning.

## 2013-10-19 ENCOUNTER — Inpatient Hospital Stay: Admission: RE | Admit: 2013-10-19 | Payer: BC Managed Care – PPO | Source: Ambulatory Visit

## 2013-12-17 ENCOUNTER — Ambulatory Visit (INDEPENDENT_AMBULATORY_CARE_PROVIDER_SITE_OTHER): Payer: BC Managed Care – PPO | Admitting: Family Medicine

## 2013-12-17 VITALS — BP 124/90 | HR 61 | Temp 98.1°F | Resp 18 | Ht 61.25 in | Wt 144.0 lb

## 2013-12-17 DIAGNOSIS — M545 Low back pain, unspecified: Secondary | ICD-10-CM

## 2013-12-17 DIAGNOSIS — R1012 Left upper quadrant pain: Secondary | ICD-10-CM

## 2013-12-17 LAB — POCT CBC
Granulocyte percent: 64.8 %G (ref 37–80)
HCT, POC: 45.9 % (ref 43.5–53.7)
HEMOGLOBIN: 15 g/dL (ref 14.1–18.1)
LYMPH, POC: 1.9 (ref 0.6–3.4)
MCH, POC: 27.9 pg (ref 27–31.2)
MCHC: 32.7 g/dL (ref 31.8–35.4)
MCV: 85.2 fL (ref 80–97)
MID (cbc): 0.6 (ref 0–0.9)
MPV: 7.8 fL (ref 0–99.8)
POC GRANULOCYTE: 4.5 (ref 2–6.9)
POC LYMPH PERCENT: 27 %L (ref 10–50)
POC MID %: 8.2 %M (ref 0–12)
Platelet Count, POC: 281 10*3/uL (ref 142–424)
RBC: 5.38 M/uL (ref 4.69–6.13)
RDW, POC: 13.6 %
WBC: 7 10*3/uL (ref 4.6–10.2)

## 2013-12-17 LAB — POCT URINALYSIS DIPSTICK
Bilirubin, UA: NEGATIVE
Glucose, UA: NEGATIVE
Ketones, UA: NEGATIVE
LEUKOCYTES UA: NEGATIVE
NITRITE UA: NEGATIVE
PH UA: 7
PROTEIN UA: NEGATIVE
RBC UA: NEGATIVE
Spec Grav, UA: 1.015
Urobilinogen, UA: 0.2

## 2013-12-17 LAB — POCT UA - MICROSCOPIC ONLY
CASTS, UR, LPF, POC: NEGATIVE
Crystals, Ur, HPF, POC: NEGATIVE
Mucus, UA: NEGATIVE
RBC, urine, microscopic: NEGATIVE
WBC, Ur, HPF, POC: NEGATIVE
YEAST UA: NEGATIVE

## 2013-12-17 LAB — COMPREHENSIVE METABOLIC PANEL
ALT: 22 U/L (ref 0–53)
AST: 22 U/L (ref 0–37)
Albumin: 4.5 g/dL (ref 3.5–5.2)
Alkaline Phosphatase: 58 U/L (ref 39–117)
BUN: 11 mg/dL (ref 6–23)
CALCIUM: 9.7 mg/dL (ref 8.4–10.5)
CHLORIDE: 102 meq/L (ref 96–112)
CO2: 26 meq/L (ref 19–32)
CREATININE: 0.61 mg/dL (ref 0.50–1.35)
Glucose, Bld: 85 mg/dL (ref 70–99)
Potassium: 4.7 mEq/L (ref 3.5–5.3)
Sodium: 139 mEq/L (ref 135–145)
Total Bilirubin: 0.6 mg/dL (ref 0.2–1.2)
Total Protein: 7.6 g/dL (ref 6.0–8.3)

## 2013-12-17 LAB — LIPASE

## 2013-12-17 LAB — POCT SEDIMENTATION RATE: POCT SED RATE: 34 mm/h — AB (ref 0–22)

## 2013-12-17 MED ORDER — OXYCODONE-ACETAMINOPHEN 5-325 MG PO TABS: 1.0000 | ORAL_TABLET | Freq: Three times a day (TID) | ORAL | Status: AC | PRN

## 2013-12-17 MED ORDER — HYDROCODONE-ACETAMINOPHEN 5-325 MG PO TABS: 1.0000 | ORAL_TABLET | Freq: Four times a day (QID) | ORAL | Status: DC | PRN

## 2013-12-17 MED ORDER — PANTOPRAZOLE SODIUM 40 MG PO TBEC: 40.0000 mg | DELAYED_RELEASE_TABLET | Freq: Every day | ORAL | Status: AC

## 2013-12-17 MED ORDER — CELECOXIB 200 MG PO CAPS: 200.0000 mg | ORAL_CAPSULE | Freq: Two times a day (BID) | ORAL | Status: AC

## 2013-12-17 MED ORDER — CYCLOBENZAPRINE HCL 10 MG PO TABS: 10.0000 mg | ORAL_TABLET | Freq: Every evening | ORAL | Status: AC | PRN

## 2013-12-17 MED ORDER — GI COCKTAIL ~~LOC~~
45.0000 mL | Freq: Once | ORAL | Status: AC
  Administered 2013-12-17: 45 mL via ORAL

## 2013-12-17 MED ORDER — GI COCKTAIL ~~LOC~~: 30.0000 mL | Freq: Once | ORAL | Status: DC

## 2013-12-17 NOTE — Patient Instructions (Addendum)
Hopefully we will find out what is going on with your ultrasound tomorrow.  If not and your symptoms continue, the next steps would be considering a CT scan and referring you to a GI doctor.  Try not to take any over the counter medicine other than tylenol due to your stomach pain.  Food Choices for Gastroesophageal Reflux Disease When you have gastroesophageal reflux disease (GERD), the foods you eat and your eating habits are very important. Choosing the right foods can help ease the discomfort of GERD. WHAT GENERAL GUIDELINES DO I NEED TO FOLLOW?  Choose fruits, vegetables, whole grains, low-fat dairy products, and low-fat meat, fish, and poultry.  Limit fats such as oils, salad dressings, butter, nuts, and avocado.  Keep a food diary to identify foods that cause symptoms.  Avoid foods that cause reflux. These may be different for different people.  Eat frequent small meals instead of three large meals each day.  Eat your meals slowly, in a relaxed setting.  Limit fried foods.  Cook foods using methods other than frying.  Avoid drinking alcohol.  Avoid drinking large amounts of liquids with your meals.  Avoid bending over or lying down until 2-3 hours after eating. WHAT FOODS ARE NOT RECOMMENDED? The following are some foods and drinks that may worsen your symptoms: Vegetables Tomatoes. Tomato juice. Tomato and spaghetti sauce. Chili peppers. Onion and garlic. Horseradish. Fruits Oranges, grapefruit, and lemon (fruit and juice). Meats High-fat meats, fish, and poultry. This includes hot dogs, ribs, ham, sausage, salami, and bacon. Dairy Whole milk and chocolate milk. Sour cream. Cream. Butter. Ice cream. Cream cheese.  Beverages Coffee and tea, with or without caffeine. Carbonated beverages or energy drinks. Condiments Hot sauce. Barbecue sauce.  Sweets/Desserts Chocolate and cocoa. Donuts. Peppermint and spearmint. Fats and Oils High-fat foods, including [place] fries  and potato chips. Other Vinegar. Strong spices, such as black pepper, white pepper, red pepper, cayenne, curry powder, cloves, ginger, and chili powder. The items listed above may not be a complete list of foods and beverages to avoid. Contact your dietitian for more information. Document Released: 01/15/2005 Document Revised: 01/20/2013 Document Reviewed: 11/19/2012 [facility] Patient Information 2015 [employer] [place]. This information is not intended to replace advice given to you by your health care provider. Make sure you discuss any questions you have with your health care provider.

## 2013-12-17 NOTE — Progress Notes (Signed)
Subjective:    Patient ID: Oscar [name], male    DOB: 05/09/1980, 33 y.o.   MRN: [id number] This chart was scribed for Oscar SorensonEva Shaw, MD by Littie Deedsichard Sun, Medical Scribe. This patient was seen in Room 13 and the patient's care was started at 2:01 PM.  Chief Complaint  Patient presents with  . Abdominal Pain    Lower, X this morning  . Back Pain    Lower, X this morning    HPI HPI Comments: Oscar [name] is a 33 y.o. male with a hx of heptatis B who presents to the [facility] complaining of gradual onset abdominal pain that began 2 months ago but worsened this morning. Patient also reports having back pain and diaphoresis. He states his pain has not improved much since last time he was seen. He has been eating healthier, avoiding oily foods and alcohol. Patient has not tried any OTC treatments. The pain does not worsen with deep breaths. Patient denies fever, chills, leg pain, urinary symptoms, and bowel symptoms.  History reviewed. No pertinent past medical history. Current Outpatient Prescriptions on File Prior to Visit  Medication Sig Dispense Refill  . fluocinonide-emollient (LIDEX-E) 0.05 % cream Apply 1 application topically 2 (two) times daily. 30 g 0  . simvastatin (ZOCOR) 20 MG tablet Take 1 tablet (20 mg total) by mouth at bedtime. 90 tablet 3   No current facility-administered medications on file prior to visit.   No Known Allergies   Review of Systems  Constitutional: Positive for diaphoresis. Negative for fever and chills.  Gastrointestinal: Positive for abdominal pain. Negative for nausea, vomiting, diarrhea and constipation.  Genitourinary: Negative for dysuria, urgency, frequency and hematuria.  Musculoskeletal: Positive for back pain. Negative for myalgias.       Objective:  BP 124/90 mmHg  Pulse 61  Temp(Src) 98.1 F (36.7 C) (Oral)  Resp 18  Ht 5' 1.25" (1.556 m)  Wt 144 lb (65.318 kg)  BMI 26.98 kg/m2  SpO2 97%  Physical Exam  Constitutional: He  is oriented to person, place, and time. He appears well-developed and well-nourished. No distress.  HENT:  Head: Normocephalic and atraumatic.  Mouth/Throat: Oropharynx is clear and moist. No oropharyngeal exudate.  Eyes: Pupils are equal, round, and reactive to light.  Neck: Neck supple.  Cardiovascular: Normal rate, regular rhythm, S1 normal, S2 normal and normal heart sounds.   No murmur heard. Pulmonary/Chest: Effort normal and breath sounds normal. No respiratory distress. He has no wheezes. He has no rales.  Abdominal: There is tenderness in the right upper quadrant, epigastric area and left upper quadrant. There is guarding. There is no rebound, no CVA tenderness and negative Murphy's sign.  LUQ pain with guarding. Some persistent pain in RUQ and epigastric. No rebound.  Musculoskeletal: He exhibits no edema.  Neurological: He is alert and oriented to person, place, and time. No cranial nerve deficit.  Skin: Skin is warm and dry. No rash noted.  Psychiatric: He has a normal mood and affect. His behavior is normal.  Nursing note and vitals reviewed.         Assessment & Plan:   Abdominal pain, left upper quadrant - Plan: POCT CBC, POCT SEDIMENTATION RATE, POCT urinalysis dipstick, POCT UA - Microscopic Only, GC/Chlamydia Probe Amp, Lipase, Comprehensive metabolic panel, US Abdomen Complete, gi cocktail (Maalox,Lidocaine,Donnatal), DISCONTINUED: gi cocktail (Maalox,Lidocaine,Donnatal) Unknown etiology. No improvement with prior treatment with triple therapy for H. Pylori and maintenance on omeprazole.  Will try  protonix w/ prn carafate.  Stat abd [place] scheduled for tomorrow a.m.  If [place] is neg/normal and pain persists, would then proceed w/ abd/pelvic CT scan and GI referral. Pt did not have any improvement in sxs after GI cocktail in office today - pain still very severe - so will try prn oxycodone to help while w/u is being completed.  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      Meds ordered this encounter  Medications  . DISCONTD: gi cocktail (Maalox,Lidocaine,Donnatal)    Sig:   . gi cocktail (Maalox,Lidocaine,Donnatal)    Sig:   . pantoprazole (PROTONIX) 40 MG tablet    Sig: Take 1 tablet (40 mg total) by mouth daily.    Dispense:  30 tablet    Refill:  3  . celecoxib (CELEBREX) 200 MG capsule    Sig: Take 1 capsule (200 mg total) by mouth 2 (two) times daily.    Dispense:  60 capsule    Refill:  2  . cyclobenzaprine (FLEXERIL) 10 MG tablet    Sig: Take 1 tablet (10 mg total) by mouth at bedtime as needed for muscle spasms.    Dispense:  30 tablet    Refill:  1  . DISCONTD: HYDROcodone-acetaminophen (NORCO/VICODIN) 5-325 MG per tablet    Sig: Take 1 tablet by mouth every 6 (six) hours as needed for moderate pain.    Dispense:  20 tablet    Refill:  0  . oxyCODONE-acetaminophen (ROXICET) 5-325 MG per tablet    Sig: Take 1-2 tablets by mouth every 8 (eight) hours as needed for severe pain.    Dispense:  40 tablet    Refill:  0    I personally performed the services described in this documentation, which was scribed in my presence. The recorded information has been reviewed and considered, and addended by me as needed.  Oscar Sorenson, MD MPH  Results for orders placed or performed in visit on 12/17/13  GC/Chlamydia Probe Amp  Result Value Ref Range   CT Probe RNA NEGATIVE    GC Probe RNA NEGATIVE   Lipase  Result Value Ref Range   Lipase <10 0 - 75 U/L  Comprehensive metabolic panel  Result Value Ref Range   Sodium 139 135 - 145 mEq/L   Potassium 4.7 3.5 - 5.3 mEq/L   Chloride 102 96 - 112 mEq/L   CO2 26 19 - 32 mEq/L   Glucose, Bld 85 70 - 99 mg/dL   BUN 11 6 - 23 mg/dL   Creat 1.61 0.96 -  0.45 mg/dL   Total Bilirubin 0.6 0.2 - 1.2 mg/dL   Alkaline Phosphatase 58 39 - 117 U/L   AST 22 0 - 37 U/L   ALT 22 0 - 53 U/L   Total Protein 7.6 6.0 - 8.3 g/dL   Albumin 4.5 3.5 - 5.2 g/dL   Calcium 9.7 8.4 - 40.9 mg/dL  POCT CBC  Result Value Ref Range   WBC 7.0 4.6 - 10.2 K/uL   Lymph, poc 1.9 0.6 - 3.4   POC LYMPH PERCENT 27.0 10 - 50 %L   MID (cbc) 0.6 0 - 0.9   POC MID % 8.2 0 - 12 %  M   POC Granulocyte 4.5 2 - 6.9   Granulocyte percent 64.8 37 - 80 %G   RBC 5.38 4.69 - 6.13 M/uL   Hemoglobin 15.0 14.1 - 18.1 g/dL   HCT, POC 04.545.9 40.943.5 - 53.7 %   MCV 85.2 80 - 97 fL   MCH, POC 27.9 27 - 31.2 pg   MCHC 32.7 31.8 - 35.4 g/dL   RDW, POC 81.113.6 %   Platelet Count, POC 281 142 - 424 K/uL   MPV 7.8 0 - 99.8 fL  POCT SEDIMENTATION RATE  Result Value Ref Range   POCT SED RATE 34 (A) 0 - 22 mm/hr  POCT urinalysis dipstick  Result Value Ref Range   Color, UA yellow    Clarity, UA clear    Glucose, UA neg    Bilirubin, UA neg    Ketones, UA neg    Spec Grav, UA 1.015    Blood, UA neg    pH, UA 7.0    Protein, UA neg    Urobilinogen, UA 0.2    Nitrite, UA neg    Leukocytes, UA Negative   POCT UA - Microscopic Only  Result Value Ref Range   WBC, Ur, HPF, POC neg    RBC, urine, microscopic neg    Bacteria, U Microscopic trace    Mucus, UA neg    Epithelial cells, urine per micros 0-1    Crystals, Ur, HPF, POC neg    Casts, Ur, LPF, POC neg    Yeast, UA neg

## 2013-12-18 ENCOUNTER — Telehealth: Payer: Self-pay

## 2013-12-18 ENCOUNTER — Ambulatory Visit (HOSPITAL_COMMUNITY)
Admission: RE | Admit: 2013-12-18 | Discharge: 2013-12-18 | Disposition: A | Discharge status: Home / Self Care | Payer: BC Managed Care – PPO | Source: Ambulatory Visit | Attending: Family Medicine | Admitting: Family Medicine

## 2013-12-18 DIAGNOSIS — R109 Unspecified abdominal pain: Secondary | ICD-10-CM | POA: Diagnosis present

## 2013-12-18 DIAGNOSIS — K760 Fatty (change of) liver, not elsewhere classified: Secondary | ICD-10-CM | POA: Diagnosis not present

## 2013-12-18 DIAGNOSIS — R1012 Left upper quadrant pain: Secondary | ICD-10-CM

## 2013-12-18 LAB — GC/CHLAMYDIA PROBE AMP
CT Probe RNA: NEGATIVE
GC PROBE AMP APTIMA: NEGATIVE

## 2013-12-18 NOTE — Telephone Encounter (Signed)
Please advise results on US to report to pt.

## 2013-12-18 NOTE — Telephone Encounter (Signed)
Pt called. Wants to speak with Dr. Clelia CroftShaw about ultrasound. CB # [phone]

## 2013-12-18 NOTE — Telephone Encounter (Signed)
nml but for fatty liver, no cause of pain found.  Fatty liver is almost never painful and can be treated by a low fat diet and exercise. Unsure of cause of abdominal pain.  I will send a different type of stomach pain medicine to his pharmacy for him to try - carafate - and I will also be happy to refer him to a GI doctor for further evaluation.

## 2013-12-19 MED ORDER — SUCRALFATE 1 GM/10ML PO SUSP: 1.0000 g | Freq: Three times a day (TID) | ORAL | Status: AC

## 2013-12-19 NOTE — Telephone Encounter (Signed)
Left message on machine to call back  

## 2013-12-19 NOTE — Telephone Encounter (Signed)
Pt notified. Maralyn SagoSarah, can we send in Carafate for pt. Dr. Clelia CroftShaw did not yesterday. Thanks  Pt does not want GI referral yet. Will wait and see if he gets better, and if he doesn't get better, will call back for referral.

## 2013-12-19 NOTE — Telephone Encounter (Signed)
Done

## 2013-12-23 ENCOUNTER — Telehealth: Payer: Self-pay

## 2013-12-23 NOTE — Telephone Encounter (Signed)
Pt was confused as the the medication being a suspension.  Advised pt that this was the form that the medication comes in to coat the stomach and hopefully ease the stomach pain. Pt understands and will start today.

## 2013-12-23 NOTE — Telephone Encounter (Signed)
Oscar [name] - Pt has question about the medication you prescribed him.  He is still having stomach pain.  Please call [phone]

## 2016-02-23 IMAGING — US US ABDOMEN COMPLETE
1 series · 14 of 25 positions shown · non-contrast
Comparison: None.

CLINICAL DATA: Initial encounter for abdominal pain

EXAM:
ULTRASOUND ABDOMEN COMPLETE

[Series 1: us abdomen complete · 0.22mm/px · 14 of 56 slices shown]
[im 1/56]
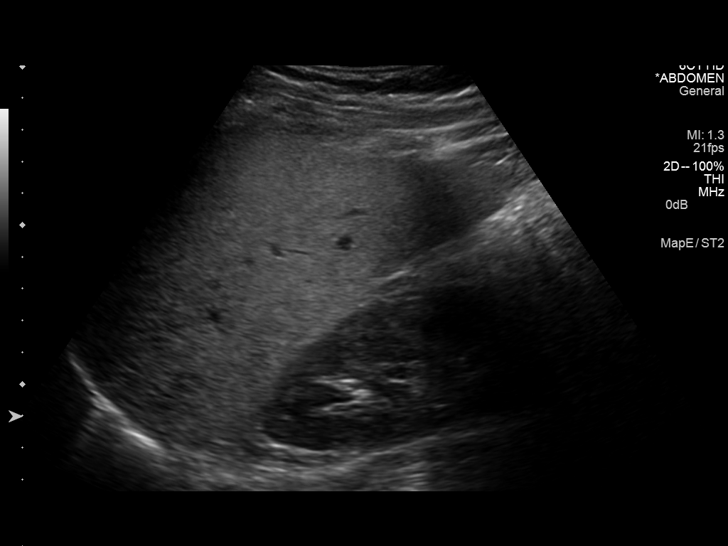
[im 5/56]
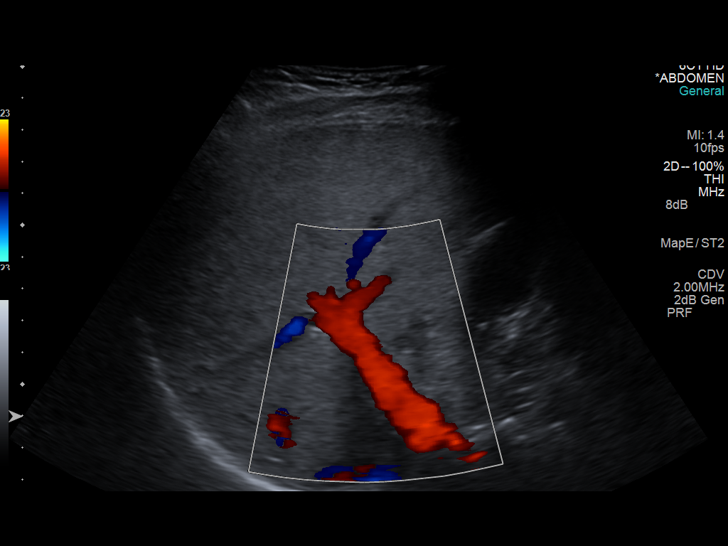
[im 10/56]
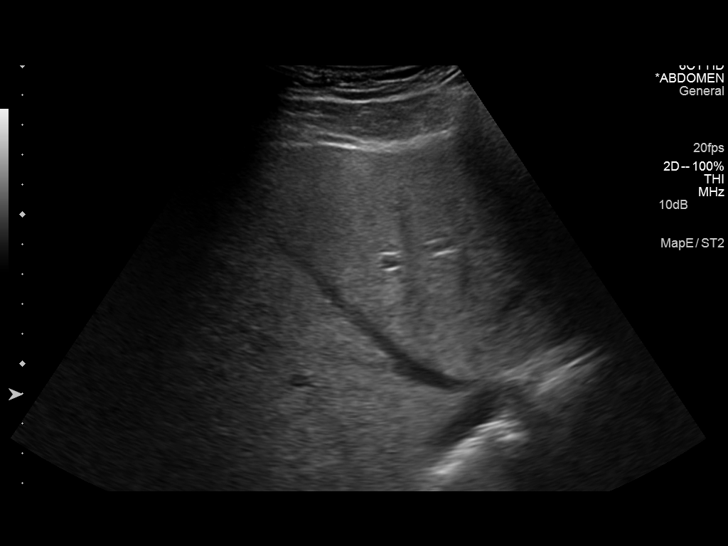
[im 14/56]
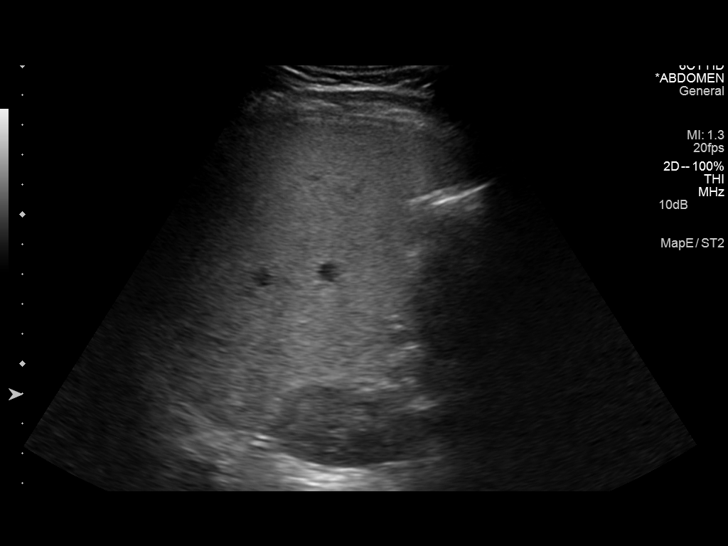
[im 19/56]
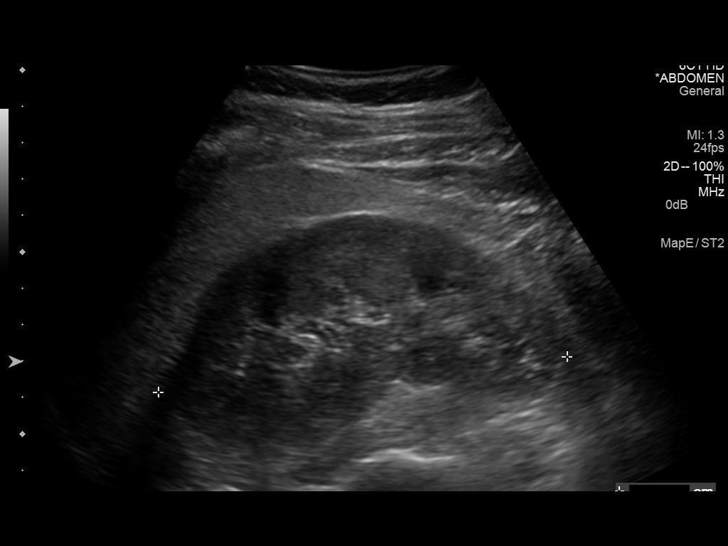
[im 21/56]
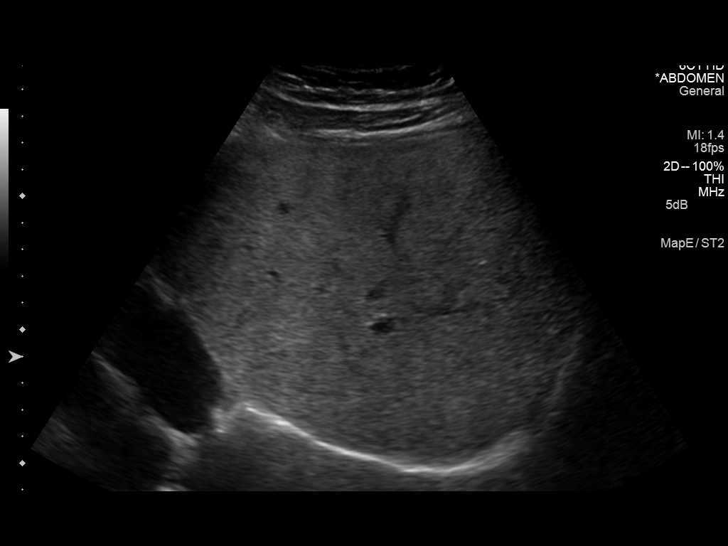
[im 26/56]
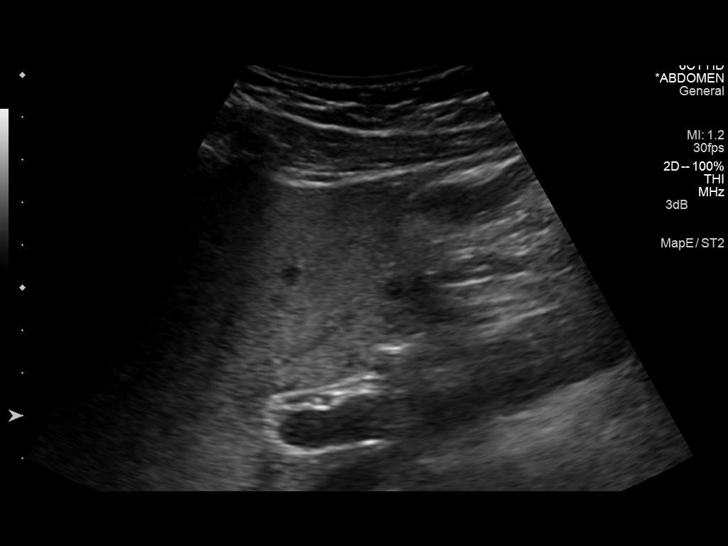
[im 30/56]
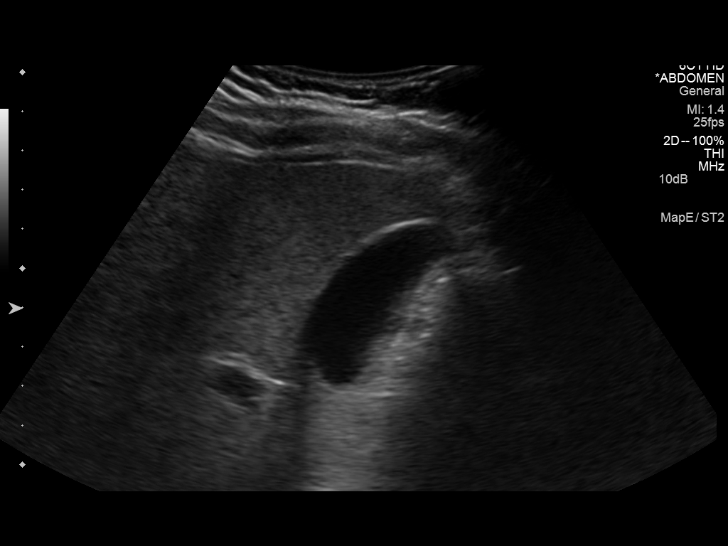
[im 35/56]
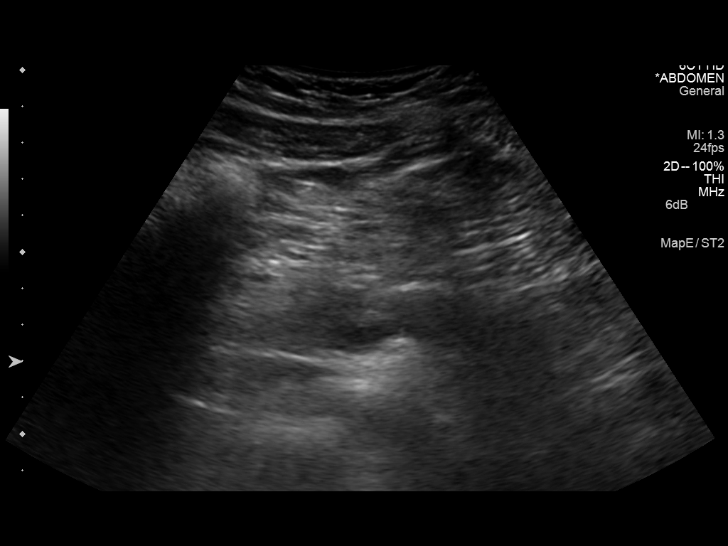
[im 37/56]
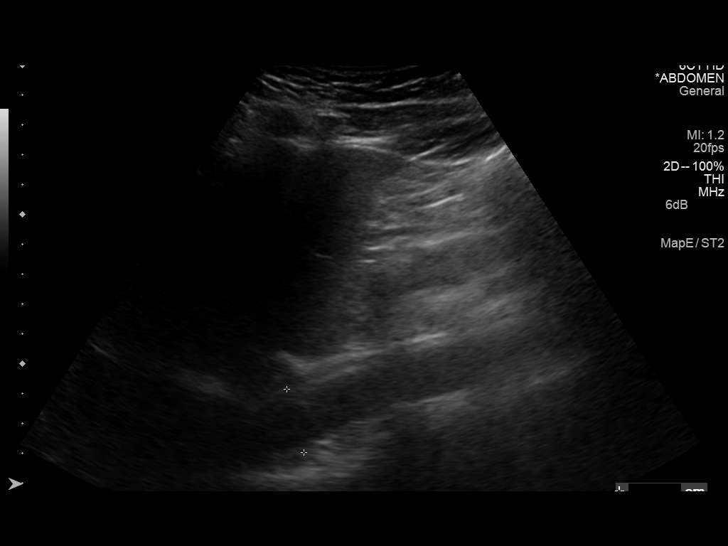
[im 42/56]
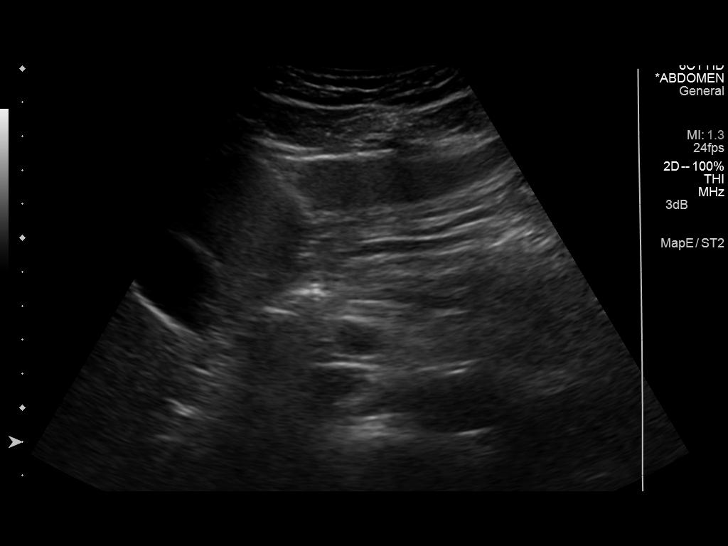
[im 46/56]
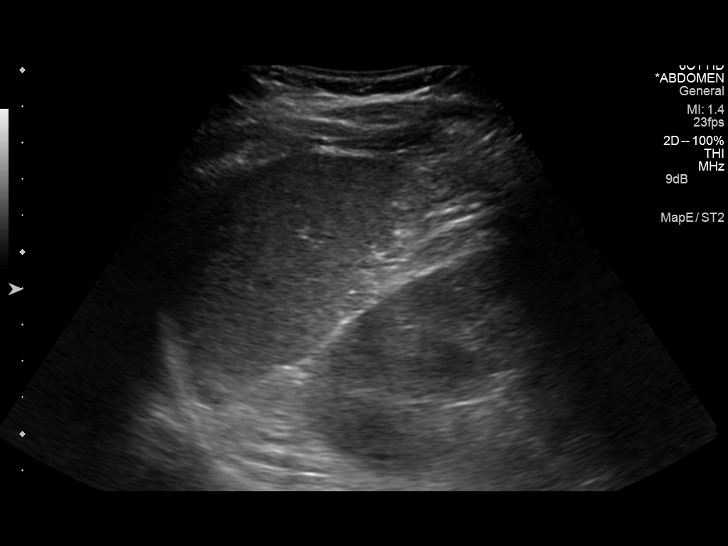
[im 51/56]
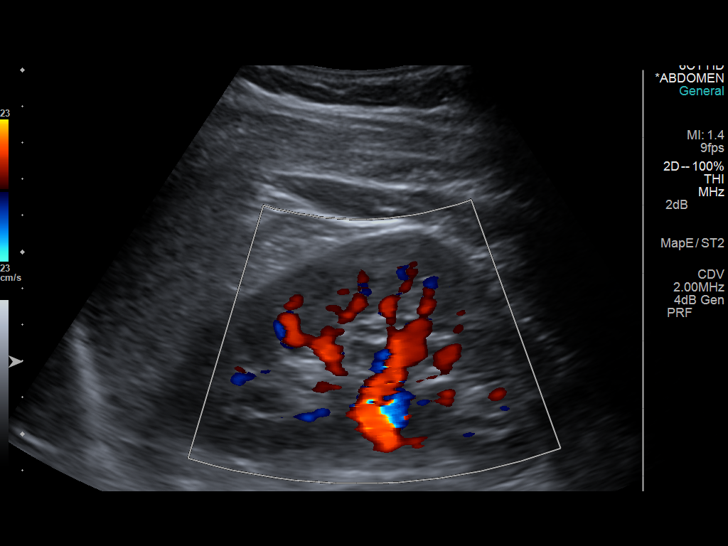
[im 56/56]
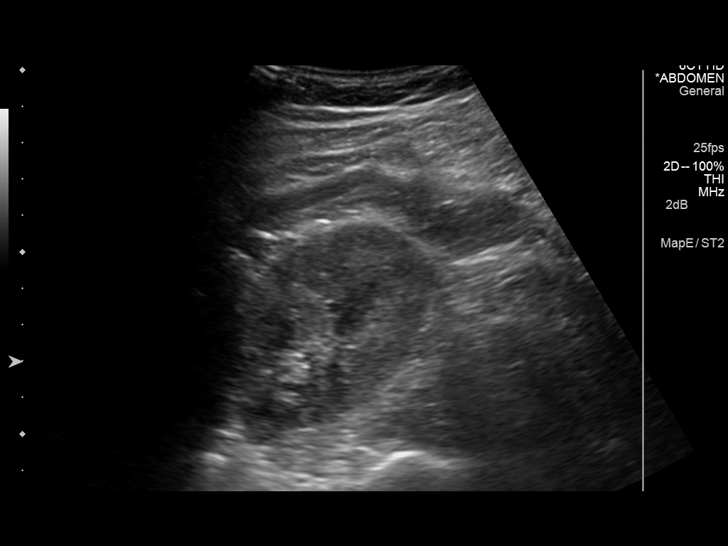

[14 of 25 positions shown; findings below may reference images not displayed]

FINDINGS: Gallbladder: No gallstones or gallbladder wall thickening. No
pericholecystic fluid. The sonographer reports no sonographic
Murphy's sign.

Common bile duct: Diameter: Nondilated at 3 mm diameter.

Liver: Diffuse coarsening of the echotexture is compatible
steatosis. Small area of focal fatty sparing is seen adjacent to the
gallbladder fossa. No intrahepatic biliary duct dilatation.

IVC: No abnormality visualized.

Pancreas: Visualized portion unremarkable.

Spleen: Size and appearance within normal limits.

Right Kidney: Length: 11.3 cm. Echogenicity within normal limits. No
mass or hydronephrosis visualized.

Left Kidney: Length: 11.7 cm. Echogenicity within normal limits. No
mass or hydronephrosis visualized.

Abdominal aorta: No aneurysm visualized although distal aorta
difficult to visualize secondary to overlying bowel gas.

Other findings: None.
IMPRESSION: No sonographic findings to explain the patient's history of pain.

Steatosis.
